# Patient Record
Sex: Male | Born: 1965
Health system: Southern US, Community
[De-identification: ages and names within clinical notes are randomized; demographics above are authoritative.]

## PROBLEM LIST (undated history)

## (undated) DIAGNOSIS — M109 Gout, unspecified: Secondary | ICD-10-CM

## (undated) DIAGNOSIS — E785 Hyperlipidemia, unspecified: Secondary | ICD-10-CM

## (undated) DIAGNOSIS — M1990 Unspecified osteoarthritis, unspecified site: Secondary | ICD-10-CM

## (undated) DIAGNOSIS — H409 Unspecified glaucoma: Secondary | ICD-10-CM

## (undated) DIAGNOSIS — T7840XA Allergy, unspecified, initial encounter: Secondary | ICD-10-CM

## (undated) HISTORY — DX: Unspecified glaucoma: H40.9

## (undated) HISTORY — DX: Unspecified osteoarthritis, unspecified site: M19.90

## (undated) HISTORY — DX: Hyperlipidemia, unspecified: E78.5

## (undated) HISTORY — DX: Allergy, unspecified, initial encounter: T78.40XA

---

## 2012-08-08 ENCOUNTER — Ambulatory Visit (INDEPENDENT_AMBULATORY_CARE_PROVIDER_SITE_OTHER): Payer: BC Managed Care – PPO | Admitting: Physician Assistant

## 2012-08-08 VITALS — BP 117/69 | HR 65 | Temp 98.2°F | Resp 17 | Ht 66.5 in | Wt 158.0 lb

## 2012-08-08 DIAGNOSIS — E785 Hyperlipidemia, unspecified: Secondary | ICD-10-CM

## 2012-08-08 DIAGNOSIS — M2550 Pain in unspecified joint: Secondary | ICD-10-CM

## 2012-08-08 DIAGNOSIS — M62838 Other muscle spasm: Secondary | ICD-10-CM

## 2012-08-08 DIAGNOSIS — M720 Palmar fascial fibromatosis [Dupuytren]: Secondary | ICD-10-CM

## 2012-08-08 LAB — POCT URINALYSIS DIPSTICK
Bilirubin, UA: NEGATIVE
Blood, UA: NEGATIVE
Glucose, UA: NEGATIVE
Ketones, UA: NEGATIVE
Nitrite, UA: NEGATIVE
Spec Grav, UA: 1.02
pH, UA: 7

## 2012-08-08 LAB — POCT UA - MICROSCOPIC ONLY
Bacteria, U Microscopic: NEGATIVE
Casts, Ur, LPF, POC: NEGATIVE
Crystals, Ur, HPF, POC: NEGATIVE
Epithelial cells, urine per micros: NEGATIVE
RBC, urine, microscopic: NEGATIVE

## 2012-08-08 LAB — POCT CBC
HCT, POC: 41.2 % — AB (ref 43.5–53.7)
MCH, POC: 28.5 pg (ref 27–31.2)
MCV: 91 fL (ref 80–97)
MID (cbc): 0.4 (ref 0–0.9)
POC LYMPH PERCENT: 35.5 %L (ref 10–50)
Platelet Count, POC: 226 10*3/uL (ref 142–424)
RBC: 4.53 M/uL — AB (ref 4.69–6.13)
WBC: 5.9 10*3/uL (ref 4.6–10.2)

## 2012-08-08 LAB — LIPID PANEL
Cholesterol: 188 mg/dL (ref 0–200)
HDL: 40 mg/dL (ref >39)
Total CHOL/HDL Ratio: 4.7 Ratio
VLDL: 17 mg/dL (ref 0–40)

## 2012-08-08 MED ORDER — NAPROXEN 500 MG PO TABS: 500.0000 mg | ORAL_TABLET | Freq: Two times a day (BID) | ORAL | Status: DC

## 2012-08-08 MED ORDER — METAXALONE 800 MG PO TABS: 800.0000 mg | ORAL_TABLET | Freq: Three times a day (TID) | ORAL | Status: AC

## 2012-08-08 NOTE — Progress Notes (Signed)
Subjective:    Patient ID: Sean Watkins, male    DOB: Dec 12, 1966, 46 y.o.   MRN: [id number]  HPI 46 yr old hispanic male c/o R pinky pain X several weeks.  Also c/o R upper neck tightness, pain in B medial knees. Ibuprofen only helps a little.  Unsure if he has been bitten by a tick, but he does work on a farm at times.  No known injury. He does do repetitive work with his job, but can't think of any new responsibilities precipitating the pain he is having.  He is R hand dominant and his hand bothers him a lot.  He is unable to straighten his 5th digit out all the way.    No dysuria or penile d/c.  No abdominal s/sx. No f/c.  Review of Systems  All other systems reviewed and are negative.       Objective:   Physical Exam  Nursing note and vitals reviewed. Constitutional: He is oriented to person, place, and time. He appears well-developed and well-nourished.  HENT:  Head: Normocephalic and atraumatic.  Right Ear: External ear normal.  Left Ear: External ear normal.  Nose: Nose normal.  Mouth/Throat: Oropharynx is clear and moist. No oropharyngeal exudate.  Neck: Normal range of motion. Neck supple. No thyromegaly present.  Cardiovascular: Normal rate, regular rhythm and normal heart sounds.   Pulmonary/Chest: Effort normal and breath sounds normal.  Musculoskeletal:       Spasm present throughout trapezius B, R>L.  R hand, 5th digit-unable to fully extend. TTP along flexor tendon all the way into wrist. Normal grip. N-V in tact.  B medial knee-TTP along medial patellar tendon. No visible swelling or erythema.  Joints themselves are stable B.   Lymphadenopathy:    He has no cervical adenopathy.  Neurological: He is oriented to person, place, and time. He has normal reflexes. He displays normal reflexes.       UE DTR=B  Skin: Skin is warm and dry. No rash noted.  Psychiatric: He has a normal mood and affect. His behavior is normal. Thought content normal.     Results for  orders placed in visit on 08/08/12  POCT CBC      Component Value Range   WBC 5.9  4.6 - 10.2 K/uL   Lymph, poc 2.1  0.6 - 3.4   POC LYMPH PERCENT 35.5  10 - 50 %L   MID (cbc) 0.4  0 - 0.9   POC MID % 7.2  0 - 12 %M   POC Granulocyte 3.4  2 - 6.9   Granulocyte percent 57.3  37 - 80 %G   RBC 4.53 (*) 4.69 - 6.13 M/uL   Hemoglobin 12.9 (*) 14.1 - 18.1 g/dL   HCT, POC 65.7 (*) 84.6 - 53.7 %   MCV 91.0  80 - 97 fL   MCH, POC 28.5  27 - 31.2 pg   MCHC 31.3 (*) 31.8 - 35.4 g/dL   RDW, POC 96.2     Platelet Count, POC 226  142 - 424 K/uL   MPV 9.8  0 - 99.8 fL  GLUCOSE, POCT (MANUAL RESULT ENTRY)      Component Value Range   POC Glucose 83  70 - 99 mg/dl  POCT URINALYSIS DIPSTICK      Component Value Range   Color, UA yellow     Clarity, UA clear     Glucose, UA neg     Bilirubin, UA neg  Ketones, UA neg     Spec Grav, UA 1.020     Blood, UA neg     pH, UA 7.0     Protein, UA neg     Urobilinogen, UA 0.2     Nitrite, UA neg     Leukocytes, UA Negative    POCT UA - MICROSCOPIC ONLY      Component Value Range   WBC, Ur, HPF, POC 0-1     RBC, urine, microscopic neg     Bacteria, U Microscopic neg     Mucus, UA trace     Epithelial cells, urine per micros neg     Crystals, Ur, HPF, POC neg     Casts, Ur, LPF, POC neg     Yeast, UA neg          Assessment & Plan:  Musculoskeletal pain-labs pending. Naprosyn and skelaxin for pain R hand-seems like a separate issue all together-likely dupuytren's contracture. Splinted in extension.

## 2012-08-11 ENCOUNTER — Telehealth: Payer: Self-pay

## 2012-08-11 LAB — LYME DISEASE DNA BY PCR(BORRELIA BURG): B burgdorferi DNA: NOT DETECTED

## 2012-08-11 NOTE — Telephone Encounter (Signed)
Pt is calling about labs done on [id number].  Best # Day [phone] Evening#[phone]

## 2012-08-11 NOTE — Telephone Encounter (Signed)
Pt.notified

## 2012-10-27 ENCOUNTER — Ambulatory Visit (INDEPENDENT_AMBULATORY_CARE_PROVIDER_SITE_OTHER): Payer: BC Managed Care – PPO | Admitting: Emergency Medicine

## 2012-10-27 VITALS — BP 126/78 | HR 71 | Temp 98.0°F | Resp 17 | Ht 65.5 in | Wt 158.0 lb

## 2012-10-27 DIAGNOSIS — R197 Diarrhea, unspecified: Secondary | ICD-10-CM

## 2012-10-27 MED ORDER — LOPERAMIDE HCL 2 MG PO TABS: ORAL_TABLET | ORAL | Status: DC

## 2012-10-27 NOTE — Progress Notes (Signed)
  Subjective:    Patient ID: Sean Watkins, male    DOB: 09-Mar-1966, 46 y.o.   MRN: [id number]  HPI Comments: No sick contacts  No improvement with pepto bismol  Abdominal Pain This is a new problem. The current episode started 1 to 4 weeks ago. The onset quality is gradual. The problem occurs 2 to 4 times per day. The problem has been unchanged. The pain is located in the epigastric region. The pain is moderate. The quality of the pain is colicky and cramping. The abdominal pain does not radiate. Associated symptoms include diarrhea. Pertinent negatives include no anorexia, arthralgias, belching, constipation, dysuria, fever, flatus, frequency, headaches, hematochezia, hematuria, melena, myalgias, nausea, vomiting or weight loss. The pain is aggravated by eating. The pain is relieved by nothing.  Diarrhea  Associated symptoms include abdominal pain. Pertinent negatives include no arthralgias, chills, coughing, fever, headaches, increased  flatus, myalgias, vomiting or weight loss.      Review of Systems  Constitutional: Negative for fever, chills, weight loss and diaphoresis.  HENT: Negative for ear pain, congestion, rhinorrhea, neck pain, postnasal drip and ear discharge.   Eyes: Negative for pain, discharge and redness.  Respiratory: Negative for cough, choking, chest tightness, shortness of breath and wheezing.   Cardiovascular: Negative for chest pain, palpitations and leg swelling.  Gastrointestinal: Positive for abdominal pain and diarrhea. Negative for nausea, vomiting, constipation, blood in stool, melena, hematochezia, rectal pain, anorexia and flatus.  Genitourinary: Negative for dysuria, urgency, frequency, hematuria and difficulty urinating.  Musculoskeletal: Negative for myalgias and arthralgias.  Neurological: Negative for headaches.       Objective:   Physical Exam  Constitutional: He is oriented to person, place, and time. He appears well-developed and well-nourished.    HENT:  Head: Normocephalic and atraumatic.  Right Ear: External ear normal.  Left Ear: External ear normal.  Mouth/Throat: No oropharyngeal exudate.  Eyes: Conjunctivae normal are normal. Pupils are equal, round, and reactive to light. No scleral icterus.  Neck: Normal range of motion. Neck supple.  Cardiovascular: Normal rate and regular rhythm.  Exam reveals no friction rub.   No murmur heard. Pulmonary/Chest: Effort normal and breath sounds normal.  Abdominal: Soft. He exhibits no distension and no mass. There is no tenderness. There is no rebound and no guarding.  Musculoskeletal: Normal range of motion.  Neurological: He is alert and oriented to person, place, and time.  Skin: Skin is warm and dry.          Assessment & Plan:  Diarrhea  Imodium   Follow up as needed  Stool studies if not improved

## 2012-10-29 LAB — OVA AND PARASITE SCREEN: OP: NONE SEEN

## 2012-10-29 NOTE — Progress Notes (Signed)
Reviewed and agree.

## 2012-12-30 ENCOUNTER — Ambulatory Visit (INDEPENDENT_AMBULATORY_CARE_PROVIDER_SITE_OTHER): Payer: Self-pay | Admitting: Emergency Medicine

## 2012-12-30 VITALS — BP 136/82 | HR 61 | Temp 98.1°F | Resp 16 | Ht 65.75 in | Wt 163.4 lb

## 2012-12-30 DIAGNOSIS — Z0289 Encounter for other administrative examinations: Secondary | ICD-10-CM

## 2012-12-30 NOTE — Progress Notes (Signed)
  Subjective:    Patient ID: Sean Watkins, male    DOB: 1966/01/14, 47 y.o.   MRN: [id number]  HPI  DOT exam   Review of Systems  As per HPI, otherwise negative.      Objective:   Physical Exam  GEN: WDWN, NAD, Non-toxic, A & O x 3 HEENT: Atraumatic, Normocephalic. Neck supple. No masses, No LAD. Ears and Nose: No external deformity. CV: RRR, No M/G/R. No JVD. No thrill. No extra heart sounds. PULM: CTA B, no wheezes, crackles, rhonchi. No retractions. No resp. distress. No accessory muscle use. ABD: S, NT, ND, +BS. No rebound. No HSM. EXTR: No c/c/e NEURO Normal gait.  PSYCH: Normally interactive. Conversant. Not depressed or anxious appearing.  Calm demeanor.        Assessment & Plan:   DOT exam

## 2013-07-16 ENCOUNTER — Ambulatory Visit: Payer: Self-pay | Admitting: Internal Medicine

## 2013-07-16 VITALS — BP 114/80 | HR 66 | Temp 98.4°F | Resp 16 | Ht 65.5 in | Wt 161.8 lb

## 2013-07-16 DIAGNOSIS — M25572 Pain in left ankle and joints of left foot: Secondary | ICD-10-CM

## 2013-07-16 DIAGNOSIS — M25569 Pain in unspecified knee: Secondary | ICD-10-CM

## 2013-07-16 DIAGNOSIS — M25562 Pain in left knee: Secondary | ICD-10-CM

## 2013-07-16 DIAGNOSIS — M109 Gout, unspecified: Secondary | ICD-10-CM

## 2013-07-16 DIAGNOSIS — Z8739 Personal history of other diseases of the musculoskeletal system and connective tissue: Secondary | ICD-10-CM | POA: Insufficient documentation

## 2013-07-16 DIAGNOSIS — M25579 Pain in unspecified ankle and joints of unspecified foot: Secondary | ICD-10-CM

## 2013-07-16 LAB — POCT CBC
Hemoglobin: 12.9 g/dL — AB (ref 14.1–18.1)
MCH, POC: 29.1 pg (ref 27–31.2)
MCHC: 31.5 g/dL — AB (ref 31.8–35.4)
MID (cbc): 0.5 (ref 0–0.9)
MPV: 10 fL (ref 0–99.8)
POC Granulocyte: 4 (ref 2–6.9)
POC MID %: 7.2 %M (ref 0–12)
Platelet Count, POC: 195 10*3/uL (ref 142–424)
RBC: 4.44 M/uL — AB (ref 4.69–6.13)
WBC: 6.5 10*3/uL (ref 4.6–10.2)

## 2013-07-16 LAB — COMPREHENSIVE METABOLIC PANEL
ALT: 17 U/L (ref 0–53)
AST: 19 U/L (ref 0–37)
CO2: 24 mEq/L (ref 19–32)
Chloride: 105 mEq/L (ref 96–112)
Creat: 0.73 mg/dL (ref 0.50–1.35)
Sodium: 136 mEq/L (ref 135–145)
Total Bilirubin: 0.7 mg/dL (ref 0.3–1.2)
Total Protein: 6.8 g/dL (ref 6.0–8.3)

## 2013-07-16 MED ORDER — INDOMETHACIN 50 MG PO CAPS: 50.0000 mg | ORAL_CAPSULE | Freq: Three times a day (TID) | ORAL | Status: DC

## 2013-07-16 NOTE — Progress Notes (Signed)
  Subjective:    Patient ID: Sean Watkins, male    DOB: 02-Apr-1966, 47 y.o.   MRN: [id number]  HPI Paper chart and epic reviewed in detail. Has hx of sudden onset joint pain, usually left 1st mtpj with swelling. Knee has ached some recently also. Presumptive gout dxed in past. Uric acid level not found in record. No fever, body aches.   Review of Systems See charts    Objective:   Physical Exam  Vitals reviewed. Constitutional: He is oriented to person, place, and time. He appears well-developed and well-nourished. No distress.  Eyes: EOM are normal.  Cardiovascular: Normal rate, regular rhythm and normal heart sounds.   Pulmonary/Chest: Effort normal and breath sounds normal.  Musculoskeletal: He exhibits edema and tenderness.       Left foot: He exhibits decreased range of motion, tenderness, bony tenderness and swelling.       Feet:  Neurological: He is alert and oriented to person, place, and time. No cranial nerve deficit. He exhibits normal muscle tone. Coordination normal.  Skin: Skin is warm. There is erythema.  Psychiatric: He has a normal mood and affect.    labs      Assessment & Plan:  Gout likely Indocin 50mg  tid 30

## 2013-07-16 NOTE — Patient Instructions (Signed)
Gout  Gout is an inflammatory condition (arthritis) caused by a buildup of uric acid crystals in the joints. Uric acid is a chemical that is normally present in the blood. Under some circumstances, uric acid can form into crystals in your joints. This causes joint redness, soreness, and swelling (inflammation). Repeat attacks are common. Over time, uric acid crystals can form into masses (tophi) near a joint, causing disfigurement. Gout is treatable and often preventable.  CAUSES   The disease begins with elevated levels of uric acid in the blood. Uric acid is produced by your body when it breaks down a naturally found substance called purines. This also happens when you eat certain foods such as meats and fish. Causes of an elevated uric acid level include:   Being passed down from parent to child (heredity).   Diseases that cause increased uric acid production (obesity, psoriasis, some cancers).   Excessive alcohol use.   Diet, especially diets rich in meat and seafood.   Medicines, including certain cancer-fighting drugs (chemotherapy), diuretics, and aspirin.   Chronic kidney disease. The kidneys are no longer able to remove uric acid well.   Problems with metabolism.  Conditions strongly associated with gout include:   Obesity.   High blood pressure.   High cholesterol.   Diabetes.  Not everyone with elevated uric acid levels gets gout. It is not understood why some people get gout and others do not. Surgery, joint injury, and eating too much of certain foods are some of the factors that can lead to gout.  SYMPTOMS    An attack of gout comes on quickly. It causes intense pain with redness, swelling, and warmth in a joint.   Fever can occur.   Often, only one joint is involved. Certain joints are more commonly involved:   Base of the big toe.   Knee.   Ankle.   Wrist.   Finger.  Without treatment, an attack usually goes away in a few days to weeks. Between attacks, you usually will not have  symptoms, which is different from many other forms of arthritis.  DIAGNOSIS   Your caregiver will suspect gout based on your symptoms and exam. Removal of fluid from the joint (arthrocentesis) is done to check for uric acid crystals. Your caregiver will give you a medicine that numbs the area (local anesthetic) and use a needle to remove joint fluid for exam. Gout is confirmed when uric acid crystals are seen in joint fluid, using a special microscope. Sometimes, blood, urine, and X-ray tests are also used.  TREATMENT   There are 2 phases to gout treatment: treating the sudden onset (acute) attack and preventing attacks (prophylaxis).  Treatment of an Acute Attack   Medicines are used. These include anti-inflammatory medicines or steroid medicines.   An injection of steroid medicine into the affected joint is sometimes necessary.   The painful joint is rested. Movement can worsen the arthritis.   You may use warm or cold treatments on painful joints, depending which works best for you.   Discuss the use of coffee, vitamin C, or cherries with your caregiver. These may be helpful treatment options.  Treatment to Prevent Attacks  After the acute attack subsides, your caregiver may advise prophylactic medicine. These medicines either help your kidneys eliminate uric acid from your body or decrease your uric acid production. You may need to stay on these medicines for a very long time.  The early phase of treatment with prophylactic medicine can be associated   with an increase in acute gout attacks. For this reason, during the first few months of treatment, your caregiver may also advise you to take medicines usually used for acute gout treatment. Be sure you understand your caregiver's directions.  You should also discuss dietary treatment with your caregiver. Certain foods such as meats and fish can increase uric acid levels. Other foods such as dairy can decrease levels. Your caregiver can give you a list of foods  to avoid.  HOME CARE INSTRUCTIONS    Do not take aspirin to relieve pain. This raises uric acid levels.   Only take over-the-counter or prescription medicines for pain, discomfort, or fever as directed by your caregiver.   Rest the joint as much as possible. When in bed, keep sheets and blankets off painful areas.   Keep the affected joint raised (elevated).   Use crutches if the painful joint is in your leg.   Drink enough water and fluids to keep your urine clear or pale yellow. This helps your body get rid of uric acid. Do not drink alcoholic beverages. They slow the passage of uric acid.   Follow your caregiver's dietary instructions. Pay careful attention to the amount of protein you eat. Your daily diet should emphasize fruits, vegetables, whole grains, and fat-free or low-fat milk products.   Maintain a healthy body weight.  SEEK MEDICAL CARE IF:    You have an oral temperature above 102 F (38.9 C).   You develop diarrhea, vomiting, or any side effects from medicines.   You do not feel better in 24 hours, or you are getting worse.  SEEK IMMEDIATE MEDICAL CARE IF:    Your joint becomes suddenly more tender and you have:   Chills.   An oral temperature above 102 F (38.9 C), not controlled by medicine.  MAKE SURE YOU:    Understand these instructions.   Will watch your condition.   Will get help right away if you are not doing well or get worse.  Document Released: 12/06/2000 Document Revised: 03/02/2012 Document Reviewed: 03/19/2010  Exit[employer] Patient Information 2014 [employer].

## 2013-07-20 ENCOUNTER — Telehealth: Payer: Self-pay

## 2013-07-20 MED ORDER — MELOXICAM 15 MG PO TABS: 15.0000 mg | ORAL_TABLET | Freq: Every day | ORAL | Status: DC

## 2013-07-20 NOTE — Telephone Encounter (Signed)
Assessment & Plan:   Gout likely  Indocin 50mg  tid 30  Uric acid normal, please advise

## 2013-07-20 NOTE — Telephone Encounter (Signed)
Patient was seen for foot pain last week.  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   The pain medication gave his a rash, so he quit taking it.   He is asking for something different.   [facility]  [address]  Cell # [phone]  Wife #  [phone]

## 2013-07-20 NOTE — Telephone Encounter (Signed)
Stop the indomethacin.  Try Meloxicam.  Meds ordered this encounter  Medications  . meloxicam (MOBIC) 15 MG tablet    Sig: Take 1 tablet (15 mg total) by mouth daily.    Dispense:  30 tablet    Refill:  1    Order Specific Question:  Supervising Provider    Answer:  DOOLITTLE, ROBERT P [[id number]]

## 2013-07-20 NOTE — Telephone Encounter (Signed)
Pt.notified

## 2013-07-22 ENCOUNTER — Encounter: Payer: Self-pay | Admitting: Family Medicine

## 2013-07-24 ENCOUNTER — Ambulatory Visit: Payer: Self-pay | Admitting: Physician Assistant

## 2013-07-24 VITALS — BP 110/80 | HR 54 | Temp 98.0°F | Resp 16 | Ht 66.5 in | Wt 162.0 lb

## 2013-07-24 DIAGNOSIS — M25572 Pain in left ankle and joints of left foot: Secondary | ICD-10-CM

## 2013-07-24 DIAGNOSIS — L270 Generalized skin eruption due to drugs and medicaments taken internally: Secondary | ICD-10-CM

## 2013-07-24 DIAGNOSIS — M25579 Pain in unspecified ankle and joints of unspecified foot: Secondary | ICD-10-CM

## 2013-07-24 DIAGNOSIS — L299 Pruritus, unspecified: Secondary | ICD-10-CM

## 2013-07-24 MED ORDER — CLOBETASOL PROPIONATE 0.05 % EX CREA: TOPICAL_CREAM | Freq: Two times a day (BID) | CUTANEOUS | Status: DC

## 2013-07-24 NOTE — Progress Notes (Signed)
  Subjective:    Patient ID: Sean Watkins, male    DOB: 02/13/1966, 47 y.o.   MRN: [id number]  HPI  This 47 y.o. male presents for evaluation of RIGHT foot pain.  He was seen last week and diagnosed with gout.  Prescribed indocin.  Unfortunately, he developed an itchy rash on the legs, trunk, groin and arm, so D/C'd it.  He contacted [place] and we changed him to meloxicam, and he took the first dose last night.  So far, he's tolerating it well and his foot pain has decreased.  No fever, chills.  No new rash.  The resolving lesions are still itchy and he asks for a cream.  CBC and uric acid were normal.  He notes that his father had gout. As a truck driver, he doesn't typically drink many fluids.   Review of Systems As above.    Objective:   Physical Exam Blood pressure 110/80, pulse 54, temperature 98 F (36.7 C), temperature source Oral, resp. rate 16, height 5' 6.5" (1.689 m), weight 162 lb (73.483 kg), SpO2 100.00%. Body mass index is 25.76 kg/(m^2). Well-developed, well nourished Hispanic male who is awake, alert and oriented, in NAD. HEENT: [place]/AT, sclera and conjunctiva are clear.   Neck: supple, non-tender, no lymphadenopathy, thyromegaly. Heart: Regular rate Lungs: normal effort Extremities: no cyanosis, clubbing. No erythema. No ecchymosis. No lesions. Mild edema of the forefoot.  Mildly tender.  No increased warmth. Skin: warm and dry without rash. Psychologic: good mood and appropriate affect, normal speech and behavior.        Assessment & Plan:  Pain in joint, ankle and foot, left - continue meloxicam.  Elevate. Rest. Hydrate. Anticipatory guidance. OOW note x 2 days, RTW Monday 07/26/2013  Dermatitis due to drug reaction/Itching - Plan: clobetasol cream (TEMOVATE) 0.05 %    Fernande Bras, PA-C Physician Assistant-Certified [facility]

## 2013-07-24 NOTE — Patient Instructions (Signed)
COntinue the MELOXICAM one time each day for the foot pain. Use the cream for the itching, you only need a thin layer. Drink plenty of fluids, and elevate the foot.

## 2014-11-21 ENCOUNTER — Ambulatory Visit (INDEPENDENT_AMBULATORY_CARE_PROVIDER_SITE_OTHER): Payer: Self-pay | Admitting: Emergency Medicine

## 2014-11-21 VITALS — BP 110/70 | HR 73 | Temp 98.7°F | Resp 16 | Ht 67.0 in | Wt 170.0 lb

## 2014-11-21 DIAGNOSIS — K644 Residual hemorrhoidal skin tags: Secondary | ICD-10-CM

## 2014-11-21 DIAGNOSIS — B356 Tinea cruris: Secondary | ICD-10-CM

## 2014-11-21 DIAGNOSIS — K648 Other hemorrhoids: Secondary | ICD-10-CM

## 2014-11-21 MED ORDER — TERBINAFINE HCL 250 MG PO TABS: 250.0000 mg | ORAL_TABLET | Freq: Every day | ORAL | Status: DC

## 2014-11-21 MED ORDER — DOCUSATE SODIUM 100 MG PO CAPS: 100.0000 mg | ORAL_CAPSULE | Freq: Two times a day (BID) | ORAL | Status: DC

## 2014-11-21 MED ORDER — TOLNAFTATE 1 % EX POWD: 1.0000 "application " | Freq: Two times a day (BID) | CUTANEOUS | Status: DC

## 2014-11-21 NOTE — Patient Instructions (Signed)
Gota  °(Gout) ° La gota es una artritis inflamatoria originada por la acumulación de cristales de ácido úrico en las articulaciones. El ácido úrico es una sustancia química que normalmente se encuentra en la sangre. Cuando los niveles de ácido úrico en la sangre son muy elevados, pueden formarse cristales que se depositan en las articulaciones y los tejidos. Esto causa irritación, dolor e hinchazón (inflamación). La repetición de los ataques es frecuente. Con el tiempo, los cristales de ácido úrico pueden formar masas (tofos) cerca de una articulación, destruyen el hueso y provocan una deformidad La gota es una enfermedad que puede tratarse y con frecuencia puede prevenirse. °CAUSAS  °La enfermedad comienza con niveles altos de ácido úrico en la sangre. El organismo produce ácido úrico cuando metaboliza una sustancia que se encuentra en estado natural, denominada purina. Ciertos alimentos, como carnes y pescado, contienen grandes cantidades de purinas. Las causas de ácido úrico elevado son:  °· Ser transmitida de padres a hijos (hereditaria). °· Enfermedades que causan un aumento de la producción de ácido úrico (como la obesidad, la psoriasis y ciertos tipos de cáncer). °· Abuso en el consumo de bebidas alcohólicas. °· La dieta, especialmente las dietas en las que se consume mucha carne y frutos de mar. °· Ciertos medicamentos, como los que combaten el cáncer (quimioterapia), los diuréticos y la aspirina. °· Enfermedades renales crónicas. Los riñones no pueden eliminar bien el ácido úrico. °· Problemas con el metabolismo. °Las enfermedades fuertemente asociadas a la gota son:  °· Obesidad. °· Hipertensión arterial. °· Colesterol elevado. °· Diabetes. °No todas las personas con niveles elevados de ácido úrico sufren gota. No se comprende aún por qué algunas personas padecen gota y otras no. Las cirugías, lesiones en una articulación y el consumo excesivo de ciertos alimentos son algunos de los factores que pueden  provocar ataques de gota.  °SÍNTOMAS  °· Un ataque de gota puede comenzar rápidamente. Causa un dolor intenso, con irritación, hinchazón y calor en una articulación. °· Puede haber fiebre. °· Generalmente sólo una articulación es afectada. Ciertas articulaciones se ven implicadas con más frecuencia. °¨ La base del dedo gordo del pie. °¨ La rodilla. °¨ El tobillo. °¨ La muñeca. °¨ Un dedo. °Sin tratamiento, un ataque por lo general desaparece en unos pocos días o semanas. Entre un ataque y otro, por lo general no hay síntomas, lo cual es diferente de muchas otras formas de artritis.  °DIAGNÓSTICO  °El profesional reunirá la información basándose en los síntomas y el examen físico. En algunos casos indicará estudios. Estos estudios pueden ser:  °· Análisis de sangre. °· Análisis de orina. °· Radiografías. °· Análisis de los fluidos de la articulación. En este estudio se necesita una aguja para extraer líquido de la articulación (artrocentesis). Con el uso del microscopio, se confirma la gota cuando se observan los cristales de ácido úrico en el líquido de la articulación. °TRATAMIENTO  °Hay dos fases en el tratamiento de la gota: el tratamiento del ataque de aparición repentina (agudo) y la prevención de los ataques (profilaxis).  °· Tratamiento de un ataque agudo. °¨ Se utilizan medicamentos. Se indican antiinflamatorios o corticoides. °¨ En algunos casos es necesario inyectar un corticoide en la articulación afectada. °¨ La articulación dolorosa se pone en reposo. El movimiento puede empeorar la artritis. °¨ Puede utilizar tanto tratamientos con calor o con frío para aliviar el dolor en las articulaciones, según lo que le haga mejor. °· Tratamiento para prevenir ataques. °¨ Si sufre de ataques de gota frecuentes,   el médico puede recomendarle medicamentos preventivos. Estos medicamentos se administran después de que el ataque agudo mejora. Estos medicamentos ayudan a los riñones a eliminar el ácido úrico del  organismo o a disminuir la producción de ácido úrico. Es posible que deba utilizar estos medicamentos durante un largo tiempo. °¨ La primera fase del tratamiento preventiva puede asociarse con un aumento de los ataques agudos de gota. Por esta razón, durante los primeros meses de tratamiento, su médico puede también aconsejarle que tome medicamentos que habitualmente se utilizan para el tratamiento de la gota aguda. Asegúrese de comprender todas las indicaciones del médico. El médico podrá hacer varios ajustes a la dosis del medicamento antes de que comiencen a ser efectivos. °¨ Comente el tratamiento dietético con su médico o nutricionista. El alcohol y las bebidas que contienen gran cantidad de azúcar y fructosa y los alimentos como la carne, el pollo y los frutos de mar pueden aumentar los niveles de ácido úrico. El médico o el nutricionista podrá aconsejarlo sobre las bebidas y los alimentos que debe limitar. °INSTRUCCIONES PARA EL CUIDADO EN EL HOGAR  °· No tome aspirina para el dolor. Esto eleva los niveles de ácido úrico. °· Sólo tome medicamentos de venta libre o prescriptos para calmar el dolor, las molestias o bajar la fiebre según las indicaciones de su médico. °· Haga reposo todo el tiempo que pueda. Cuando se encuentre en la cama, mantenga las sábanas y mantas alejadas de las articulaciones doloridas. °· Mantenga la articulación afectada levantada (elevada). °· Aplique compresas frías o calientes sobre las articulaciones doloridas. el uso de compresas calientes o frías depende de lo que mejor le resulte a usted. °· Utilice muletas si la articulación que le duele es de la pierna. °· Debe ingerir gran cantidad de líquido para mantener la orina de tono claro o color amarillo pálido. Esto ayudará a que el organismo elimine el ácido úrico. Limite el consumo de alcohol, bebidas azucaradas y que contengan fructosa. °· Siga las indicaciones con respecto a la dieta. Preste especial atención a la cantidad de  proteínas que consume. En la dieta diaria debe enfatizar el consumo de frutas, vegetales, granos enteros y productos lácteos descremados. Comente con su médico o nutricionista acerca del consumo de café, vitamina C o cerezas. Estos pueden ayudar a disminuir los niveles de ácido úrico. °· Mantenga un peso corporal adecuado. °SOLICITE ATENCIÓN MÉDICA SI:  °· Tiene diarrea, vómitos o algún efecto secundario provocado por los medicamentos. °· No se siente mejor en 24 horas, o empeora. °SOLICITE ATENCIÓN MÉDICA DE INMEDIATO SI:  °· Las articulaciones le duelen más de manera repentina, tiene escalofríos o fiebre. °ASEGÚRESE DE QUE:  °· Comprende estas instrucciones. °· Controlará su enfermedad. °· Solicitará ayuda de inmediato si no mejora o si empeora. °Document Released: 09/18/2005 Document Revised: 04/05/2013 °ExitCare® Patient Information ©2015 [employer]. This information is not intended to replace advice given to you by your health care provider. Make sure you discuss any questions you have with your health care provider. ° °

## 2014-11-21 NOTE — Progress Notes (Signed)
[facility] [address], [place] [address] [phone]  Date:  11/21/2014   Name:  Sean MoodyJuan Watkins   DOB:  November 21, 1966   MRN:  [id number]  PCP:  Tally DueGUEST, CHRIS WARREN, MD    Chief Complaint: Rash   History of Present Illness:  Sean Watkins is a 48 y.o. very pleasant male patient who presents with the following:  Tinea cruris and pain in rectum. Previously treated with temovate No blood in stool or painful stools. Denies other complaint or health concern today.   Patient Active Problem List   Diagnosis Date Noted  . Gout attack 07/16/2013    Past Medical History  Diagnosis Date  . Allergy   . Asthma   . Arthritis     History reviewed. No pertinent past surgical history.  History  Substance Use Topics  . Smoking status: Never Smoker   . Smokeless tobacco: Not on file  . Alcohol Use: No    Family History  Problem Relation Age of Onset  . Heart disease Mother   . Stroke Father     Allergies  Allergen Reactions  . Indocin [Indomethacin] Rash    POSSIBLY due to indocin    Medication list has been reviewed and updated.  No current outpatient prescriptions on file prior to visit.   No current facility-administered medications on file prior to visit.    Review of Systems:  As per HPI, otherwise negative.    Physical Examination: Filed Vitals:   11/21/14 1629  BP: 110/70  Pulse: 73  Temp: 98.7 F (37.1 C)  Resp: 16   Filed Vitals:   11/21/14 1629  Height: 5\' 7"  (1.702 m)  Weight: 170 lb (77.111 kg)   Body mass index is 26.62 kg/(m^2). Ideal Body Weight: Weight in (lb) to have BMI = 25: 159.3   GEN: WDWN, NAD, Non-toxic, Alert & Oriented x 3 HEENT: Atraumatic, Normocephalic.  Ears and Nose: No external deformity. EXTR: No clubbing/cyanosis/edema NEURO: Normal gait.  PSYCH: Normally interactive. Conversant. Not depressed or anxious appearing.  Calm demeanor.  Tinea cruris. Small external hemorrhoid no  clot  Assessment and Plan: External hemorrhoid Tinea cruris lamisil powder Colace lamisil tab  Signed,  Phillips OdorJeffery Makesha Belitz, MD

## 2014-12-26 ENCOUNTER — Ambulatory Visit (INDEPENDENT_AMBULATORY_CARE_PROVIDER_SITE_OTHER): Payer: Self-pay | Admitting: Emergency Medicine

## 2014-12-26 DIAGNOSIS — Z021 Encounter for pre-employment examination: Secondary | ICD-10-CM

## 2014-12-26 MED ORDER — FLUCONAZOLE 100 MG PO TABS: ORAL_TABLET | ORAL | Status: DC

## 2014-12-26 NOTE — Progress Notes (Signed)
[facility] [address] [place] [address] [phone]  Date:  12/26/2014   Name:  Sean Watkins   DOB:  August 14, 1966   MRN:  [id number]  PCP:  Tally Due, MD    Chief Complaint: No chief complaint on file.   History of Present Illness:  Sean Watkins is a 49 y.o. very pleasant male patient who presents with the following:  DOT   Patient Active Problem List   Diagnosis Date Noted  . Gout attack 07/16/2013    Past Medical History  Diagnosis Date  . Allergy   . Asthma   . Arthritis     No past surgical history on file.  History  Substance Use Topics  . Smoking status: Never Smoker   . Smokeless tobacco: Not on file  . Alcohol Use: No    Family History  Problem Relation Age of Onset  . Heart disease Mother   . Stroke Father     Allergies  Allergen Reactions  . Indocin [Indomethacin] Rash    POSSIBLY due to indocin    Medication list has been reviewed and updated.  Current Outpatient Prescriptions on File Prior to Visit  Medication Sig Dispense Refill  . docusate sodium (COLACE) 100 MG capsule Take 1 capsule (100 mg total) by mouth 2 (two) times daily. 30 capsule 0  . terbinafine (LAMISIL) 250 MG tablet Take 1 tablet (250 mg total) by mouth daily. 14 tablet 0  . tolnaftate (TINACTIN) 1 % powder Apply 1 application topically 2 (two) times daily. 45 g 0   No current facility-administered medications on file prior to visit.    Review of Systems:  As per HPI, otherwise negative.    Physical Examination: There were no vitals filed for this visit. There were no vitals filed for this visit. There is no weight on file to calculate BMI. Ideal Body Weight:    GEN: WDWN, NAD, Non-toxic, A & O x 3 HEENT: Atraumatic, Normocephalic. Neck supple. No masses, No LAD. Ears and Nose: No external deformity. CV: RRR, No M/G/R. No JVD. No thrill. No extra heart sounds. PULM: CTA B, no wheezes, crackles, rhonchi. No retractions. No  resp. distress. No accessory muscle use. ABD: S, NT, ND, +BS. No rebound. No HSM. EXTR: No c/c/e NEURO Normal gait.  PSYCH: Normally interactive. Conversant. Not depressed or anxious appearing.  Calm demeanor.    Assessment and Plan: DOT  Signed,  Phillips Odor, MD

## 2015-05-13 ENCOUNTER — Ambulatory Visit (INDEPENDENT_AMBULATORY_CARE_PROVIDER_SITE_OTHER): Payer: No Typology Code available for payment source

## 2015-05-13 ENCOUNTER — Ambulatory Visit (INDEPENDENT_AMBULATORY_CARE_PROVIDER_SITE_OTHER): Payer: No Typology Code available for payment source | Admitting: Internal Medicine

## 2015-05-13 VITALS — BP 102/72 | HR 54 | Temp 98.4°F | Resp 16 | Ht 66.5 in | Wt 169.0 lb

## 2015-05-13 DIAGNOSIS — Z8639 Personal history of other endocrine, nutritional and metabolic disease: Secondary | ICD-10-CM

## 2015-05-13 DIAGNOSIS — M79671 Pain in right foot: Secondary | ICD-10-CM | POA: Diagnosis not present

## 2015-05-13 DIAGNOSIS — Z8739 Personal history of other diseases of the musculoskeletal system and connective tissue: Secondary | ICD-10-CM

## 2015-05-13 LAB — COMPLETE METABOLIC PANEL WITH GFR
ALT: 22 U/L (ref 0–53)
AST: 19 U/L (ref 0–37)
Albumin: 4.2 g/dL (ref 3.5–5.2)
Alkaline Phosphatase: 57 U/L (ref 39–117)
BUN: 14 mg/dL (ref 6–23)
CO2: 23 mEq/L (ref 19–32)
CREATININE: 0.7 mg/dL (ref 0.50–1.35)
Calcium: 9.3 mg/dL (ref 8.4–10.5)
Chloride: 106 mEq/L (ref 96–112)
Glucose, Bld: 91 mg/dL (ref 70–99)
Potassium: 4.4 mEq/L (ref 3.5–5.3)
Sodium: 139 mEq/L (ref 135–145)
TOTAL PROTEIN: 6.9 g/dL (ref 6.0–8.3)
Total Bilirubin: 0.4 mg/dL (ref 0.2–1.2)

## 2015-05-13 LAB — URIC ACID: URIC ACID, SERUM: 7 mg/dL (ref 4.0–7.8)

## 2015-05-13 MED ORDER — MELOXICAM 15 MG PO TABS: 15.0000 mg | ORAL_TABLET | Freq: Every day | ORAL | Status: DC

## 2015-05-13 NOTE — Patient Instructions (Addendum)
Hydrate with water, 64oz of water, which is about 4 regular sized water bottles.  Gout Gout is an inflammatory arthritis caused by a buildup of uric acid crystals in the joints. Uric acid is a chemical that is normally present in the blood. When the level of uric acid in the blood is too high it can form crystals that deposit in your joints and tissues. This causes joint redness, soreness, and swelling (inflammation). Repeat attacks are common. Over time, uric acid crystals can form into masses (tophi) near a joint, destroying bone and causing disfigurement. Gout is treatable and often preventable. CAUSES  The disease begins with elevated levels of uric acid in the blood. Uric acid is produced by your body when it breaks down a naturally found substance called purines. Certain foods you eat, such as meats and fish, contain high amounts of purines. Causes of an elevated uric acid level include:  Being passed down from parent to child (heredity).  Diseases that cause increased uric acid production (such as obesity, psoriasis, and certain cancers).  Excessive alcohol use.  Diet, especially diets rich in meat and seafood.  Medicines, including certain cancer-fighting medicines (chemotherapy), water pills (diuretics), and aspirin.  Chronic kidney disease. The kidneys are no longer able to remove uric acid well.  Problems with metabolism. Conditions strongly associated with gout include:  Obesity.  High blood pressure.  High cholesterol.  Diabetes. Not everyone with elevated uric acid levels gets gout. It is not understood why some people get gout and others do not. Surgery, joint injury, and eating too much of certain foods are some of the factors that can lead to gout attacks. SYMPTOMS   An attack of gout comes on quickly. It causes intense pain with redness, swelling, and warmth in a joint.  Fever can occur.  Often, only one joint is involved. Certain joints are more commonly  involved:  Base of the big toe.  Knee.  Ankle.  Wrist.  Finger. Without treatment, an attack usually goes away in a few days to weeks. Between attacks, you usually will not have symptoms, which is different from many other forms of arthritis. DIAGNOSIS  Your caregiver will suspect gout based on your symptoms and exam. In some cases, tests may be recommended. The tests may include:  Blood tests.  Urine tests.  X-rays.  Joint fluid exam. This exam requires a needle to remove fluid from the joint (arthrocentesis). Using a microscope, gout is confirmed when uric acid crystals are seen in the joint fluid. TREATMENT  There are two phases to gout treatment: treating the sudden onset (acute) attack and preventing attacks (prophylaxis).  Treatment of an Acute Attack.  Medicines are used. These include anti-inflammatory medicines or steroid medicines.  An injection of steroid medicine into the affected joint is sometimes necessary.  The painful joint is rested. Movement can worsen the arthritis.  You may use warm or cold treatments on painful joints, depending which works best for you.  Treatment to Prevent Attacks.  If you suffer from frequent gout attacks, your caregiver may advise preventive medicine. These medicines are started after the acute attack subsides. These medicines either help your kidneys eliminate uric acid from your body or decrease your uric acid production. You may need to stay on these medicines for a very long time.  The early phase of treatment with preventive medicine can be associated with an increase in acute gout attacks. For this reason, during the first few months of treatment, your caregiver may  also advise you to take medicines usually used for acute gout treatment. Be sure you understand your caregiver's directions. Your caregiver may make several adjustments to your medicine dose before these medicines are effective.  Discuss dietary treatment with your  caregiver or dietitian. Alcohol and drinks high in sugar and fructose and foods such as meat, poultry, and seafood can increase uric acid levels. Your caregiver or dietitian can advise you on drinks and foods that should be limited. HOME CARE INSTRUCTIONS   Do not take aspirin to relieve pain. This raises uric acid levels.  Only take over-the-counter or prescription medicines for pain, discomfort, or fever as directed by your caregiver.  Rest the joint as much as possible. When in bed, keep sheets and blankets off painful areas.  Keep the affected joint raised (elevated).  Apply warm or cold treatments to painful joints. Use of warm or cold treatments depends on which works best for you.  Use crutches if the painful joint is in your leg.  Drink enough fluids to keep your urine clear or pale yellow. This helps your body get rid of uric acid. Limit alcohol, sugary drinks, and fructose drinks.  Follow your dietary instructions. Pay careful attention to the amount of protein you eat. Your daily diet should emphasize fruits, vegetables, whole grains, and fat-free or low-fat milk products. Discuss the use of coffee, vitamin C, and cherries with your caregiver or dietitian. These may be helpful in lowering uric acid levels.  Maintain a healthy body weight. SEEK MEDICAL CARE IF:   You develop diarrhea, vomiting, or any side effects from medicines.  You do not feel better in 24 hours, or you are getting worse. SEEK IMMEDIATE MEDICAL CARE IF:   Your joint becomes suddenly more tender, and you have chills or a fever. MAKE SURE YOU:   Understand these instructions.  Will watch your condition.  Will get help right away if you are not doing well or get worse. Document Released: 12/06/2000 Document Revised: 04/25/2014 Document Reviewed: 07/22/2012 [facility] Patient Information 2015 [place], [place]. This information is not intended to replace advice given to you by your health care provider. Make  sure you discuss any questions you have with your health care provider.

## 2015-05-13 NOTE — Progress Notes (Signed)
[facility] [address], [place] [address] [phone]  Date:  05/13/2015   Name:  Sean Watkins   DOB:  12/27/1965   MRN:  [id number]  PCP:  Tally Due, MD    Chief Complaint: Foot Pain   History of Present Illness:  Sean Watkins is a 49 y.o. very pleasant male with PMH of gout who presents with the following:  Patient reports one week of foot pain between 2nd and 3rd toe pain with some swelling.  He states that it is a aching pain that is exacerbated with weight on the foot.  He has denies any trauma to the foot.  He is a [employer].  He states he has indulged in more red meat.  Alcohol intake is about 4-5 drinks on the weekend without 3 drinks max per sitting.  He has had no new medications.  Father has a hx of gout.  1st and last gout flare was about 2 years ago, it resolved with NSAID use, but he had no report of elevated uric acid, and normal CMP.  Patient Active Problem List   Diagnosis Date Noted  . Gout attack 07/16/2013    Past Medical History  Diagnosis Date  . Allergy   . Asthma   . Arthritis     History reviewed. No pertinent past surgical history.  History  Substance Use Topics  . Smoking status: Never Smoker   . Smokeless tobacco: Never Used  . Alcohol Use: No    Family History  Problem Relation Age of Onset  . Heart disease Mother   . Stroke Father     Allergies  Allergen Reactions  . Indocin [Indomethacin] Rash    POSSIBLY due to indocin    Medication list has been reviewed and updated.  No current outpatient prescriptions on file prior to visit.   No current facility-administered medications on file prior to visit.    Review of Systems: ROS otherwise unremarkable unless listed above.  Physical Examination: Filed Vitals:   05/13/15 1049  BP: 102/72  Pulse: 54  Temp: 98.4 F (36.9 C)  Resp: 16   Filed Vitals:   05/13/15 1049  Height: 5' 6.5" (1.689 m)  Weight: 169 lb (76.658 kg)   Body  mass index is 26.87 kg/(m^2). Ideal Body Weight: Weight in (lb) to have BMI = 25: 156.9 Physical Exam  Constitutional: He is oriented to person, place, and time. He appears well-developed and well-nourished. No distress.  Eyes: Pupils are equal, round, and reactive to light.  Cardiovascular: Normal rate, regular rhythm and intact distal pulses.  Exam reveals no friction rub.   No murmur heard. Pulmonary/Chest: Effort normal and breath sounds normal. No respiratory distress. He has no wheezes.  Musculoskeletal:       Right foot: There is normal range of motion.  Right foot: Increased swelling extending from the MTP joint extending proximally along the foot.  Pain with passive movement of 1st-3rd toes, however normal ROM.  +Squeeze test.  Normal DP pulse and capillary refill.   Feet:  Right Foot:  Skin Integrity: Negative for skin breakdown, erythema, warmth or callus.  Neurological: He is alert and oriented to person, place, and time.  Skin: Skin is warm and dry.  Psychiatric: He has a normal mood and affect. His behavior is normal.    [facility] reading (PRIMARY) by  Dr. Perrin Maltese: No acute fractures.  Please comment on sesamoid bone.  Assessment and Plan: 49 year old  male is here today for chief complaint of foot pain for 1 week. -Given meloxicam, as this provided relief with prior use without the allergic reaction of the indocin.  Foot pain, right - Plan: DG Foot Complete Right, COMPLETE METABOLIC PANEL WITH GFR, Uric Acid, meloxicam (MOBIC) 15 MG tablet  Hx of gout - Plan: COMPLETE METABOLIC PANEL WITH GFR, Uric Acid, meloxicam (MOBIC) 15 MG tablet  Trena PlattStephanie Sara Keys, PA-C [facility] 5/24/20169:34 AM

## 2016-02-24 ENCOUNTER — Ambulatory Visit (INDEPENDENT_AMBULATORY_CARE_PROVIDER_SITE_OTHER): Payer: BLUE CROSS/BLUE SHIELD | Admitting: Family Medicine

## 2016-02-24 VITALS — BP 122/70 | HR 73 | Temp 98.2°F | Resp 12 | Ht 66.0 in | Wt 173.0 lb

## 2016-02-24 DIAGNOSIS — J301 Allergic rhinitis due to pollen: Secondary | ICD-10-CM | POA: Diagnosis not present

## 2016-02-24 MED ORDER — DESLORATADINE-PSEUDOEPHED ER 5-240 MG PO TB24: 1.0000 | ORAL_TABLET | Freq: Every day | ORAL | Status: DC

## 2016-02-24 MED ORDER — AYR SALINE NASAL NA GEL: 1.0000 "application " | NASAL | Status: DC

## 2016-02-24 NOTE — Patient Instructions (Signed)
Hay Fever Hay fever is an allergic reaction to particles in the air. It cannot be passed from person to person. It cannot be cured, but it can be controlled. CAUSES  Hay fever is caused by something that triggers an allergic reaction (allergens). The following are examples of allergens:  Ragweed.  Feathers.  Animal dander.  Grass and tree pollens.  Cigarette smoke.  House dust.  Pollution. SYMPTOMS   Sneezing.  Runny or stuffy nose.  Tearing eyes.  Itchy eyes, nose, mouth, throat, skin, or other area.  Sore throat.  Headache.  Decreased sense of smell or taste. DIAGNOSIS Your caregiver will perform a physical exam and ask questions about the symptoms you are having.Allergy testing may be done to determine exactly what triggers your hay fever.  TREATMENT   Over-the-counter medicines may help symptoms. These include:  Antihistamines.  Decongestants. These may help with nasal congestion.  Your caregiver may prescribe medicines if over-the-counter medicines do not work.  Some people benefit from allergy shots when other medicines are not helpful. HOME CARE INSTRUCTIONS   Avoid the allergen that is causing your symptoms, if possible.  Take all medicine as told by your caregiver. SEEK MEDICAL CARE IF:   You have severe allergy symptoms and your current medicines are not helping.  Your treatment was working at one time, but you are now experiencing symptoms.  You have sinus congestion and pressure.  You develop a fever or headache.  You have thick nasal discharge.  You have asthma and have a worsening cough and wheezing. SEEK IMMEDIATE MEDICAL CARE IF:   You have swelling of your tongue or lips.  You have trouble breathing.  You feel lightheaded or like you are going to faint.  You have cold sweats.  You have a fever.   This information is not intended to replace advice given to you by your health care provider. Make sure you discuss any  questions you have with your health care provider.   Document Released: 12/09/2005 Document Revised: 03/02/2012 Document Reviewed: 06/21/2015 Elsevier Interactive Patient Education 2016 Elsevier Inc.  

## 2016-02-24 NOTE — Progress Notes (Signed)
Subjective:  By signing my name below, I, [name], attest that this documentation has been prepared under the direction and in the presence of Norberto Sorenson, MD.  Electronically Signed: Andrew Au, ED Scribe. 02/24/2016. 11:15 AM.   Patient ID: Sean Watkins, male    DOB: 05/01/1966, 50 y.o.   MRN: [id number]  HPI Chief Complaint  Patient presents with  . 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 male with hx of allergies and asthma who [facility] complaining of worsening allergy symptoms that have worsened 3 days ago . He reports itchy watery eyes, sneezing, rhinorrhea, post nasal drip, frontal ST and occasional epistaxis. 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  He has not tried OTC medication due to not wanting to take a chance with his job. He would like a medication that is not sedative.  He denies pertinent medical hx. He denies fever and chills or feeling ill.   Past Medical History  Diagnosis Date  . Allergy   . Asthma   . Arthritis    History reviewed. No pertinent past surgical history. Prior to Admission medications   Medication Sig Start Date End Date Taking? Authorizing Provider  meloxicam (MOBIC) 15 MG tablet Take 1 tablet (15 mg total) by mouth daily. Patient not taking: Reported on 02/24/2016 05/13/15   Collie Siad English, PA   Review of Systems  Constitutional: Positive for fatigue. Negative for fever and chills.  HENT: Positive for congestion, nosebleeds, rhinorrhea, sinus pressure, sneezing and sore throat.   Eyes: Positive for discharge ( watery) and itching.   Objective:   Physical Exam  Constitutional: He is oriented to person, place, and time. He appears well-developed and well-nourished. No distress.  HENT:  Head: Normocephalic and atraumatic.  Right Ear:  Tympanic membrane is retracted.  Left Ear: Hearing and tympanic membrane normal.  Nose: Mucosal edema present.  Mouth/Throat: Posterior oropharyngeal erythema present.  Severe nasal edema and erythema with very friable septum bilaterally.   Eyes: Conjunctivae and EOM are normal.  Neck: Neck supple.  Cardiovascular: Normal rate, regular rhythm and normal heart sounds.   Pulmonary/Chest: Effort normal and breath sounds normal. No respiratory distress. He has no decreased breath sounds. He has no wheezes. He has no rhonchi. He has no rales. He exhibits no tenderness.  Musculoskeletal: Normal range of motion.  Neurological: He is alert and oriented to person, place, and time.  Skin: Skin is warm and dry.  Psychiatric: He has a normal mood and affect. His behavior is normal.  Nursing note and vitals reviewed.  Filed Vitals:   02/24/16 1019  BP: 122/70  Pulse: 73  Temp: 98.2 F (36.8 C)  TempSrc: Oral  Resp: 12  Height:  (1.676 m)  Weight: 173 lb (78.472 kg)  SpO2: 98%    Assessment & Plan:    1. Hay fever     Meds ordered this encounter  Medications  . desloratadine-pseudoephedrine (CLARINEX-D 24 HOUR) 5-240 MG 24 hr tablet    Sig: Take 1 tablet by mouth daily.    Dispense:  30 tablet    Refill:  5  . saline (AYR) GEL    Sig: Place 1 application into the nose every 4 (four) hours.    Dispense:  14 g  Refill:  2    I personally performed the services described in this documentation, which was scribed in my presence. The recorded information has been reviewed and considered, and addended by me as needed.  Norberto SorensonEva Tien Spooner, MD MPH

## 2016-11-08 ENCOUNTER — Telehealth: Payer: Self-pay

## 2016-11-08 ENCOUNTER — Ambulatory Visit (INDEPENDENT_AMBULATORY_CARE_PROVIDER_SITE_OTHER): Payer: Self-pay | Admitting: Urgent Care

## 2016-11-08 VITALS — BP 130/78 | HR 77 | Temp 98.7°F | Resp 16 | Ht 66.0 in | Wt 182.0 lb

## 2016-11-08 DIAGNOSIS — Z024 Encounter for examination for driving license: Secondary | ICD-10-CM

## 2016-11-08 NOTE — Telephone Encounter (Signed)
Pt is looking for lab results   Best number [phone]

## 2016-11-08 NOTE — Progress Notes (Signed)
Airline pilotCommercial Driver Medical Examination   Sean MoodyJuan Mangini is a 50 y.o. male who presents today for a DOT physical exam. Denies history of diabetes, HTN, HL, heart disease, mental illness. Denies dizziness, chronic headache, blurred vision, chest pain, shortness of breath, heart racing, palpitations, nausea, vomiting, abdominal pain, hematuria, lower leg swelling. Denies smoking cigarettes or drinking alcohol.   The following portions of the patient's history were reviewed and updated as appropriate: allergies, current medications, past family history, past medical history, past social history and past surgical history.  Objective:   BP 130/78 (BP Location: Right Arm, Patient Position: Sitting, Cuff Size: Normal)   Pulse 77   Temp 98.7 F (37.1 C)   Resp 16   Ht 5\' 6"  (1.676 m)   Wt 182 lb (82.6 kg)   SpO2 98%   BMI 29.38 kg/m   Vision/hearing: Vision Screening Comments: Peripheral Vision: Right eye 70 degrees. Left eye 70 degrees.  Patient can recognize and distinguish among traffic control signals and devices showing standard red, green, and amber colors.  Corrective lenses required: No  Monocular Vision?: No  Hearing aid requirement: No  Physical Exam  Constitutional: He is oriented to person, place, and time. He appears well-developed and well-nourished.  HENT:  TM's intact bilaterally, no effusions or erythema. Nasal turbinates pink and moist, nasal passages patent. No sinus tenderness. Oropharynx clear, mucous membranes moist, dentition in good repair.  Eyes: Conjunctivae and EOM are normal. Pupils are equal, round, and reactive to light. Right eye exhibits no discharge. Left eye exhibits no discharge. No scleral icterus.  Neck: Normal range of motion. Neck supple. No thyromegaly present.  Cardiovascular: Normal rate, regular rhythm and intact distal pulses.  Exam reveals no gallop and no friction rub.   No murmur heard. Pulmonary/Chest: No stridor. No respiratory  distress. He has no wheezes. He has no rales.  Abdominal: Soft. Bowel sounds are normal. He exhibits no distension and no mass. There is no tenderness.  Musculoskeletal: Normal range of motion. He exhibits no edema or tenderness.  Lymphadenopathy:    He has no cervical adenopathy.  Neurological: He is alert and oriented to person, place, and time. He has normal reflexes.  Skin: Skin is warm and dry. No rash noted. No erythema. No pallor.  Psychiatric: He has a normal mood and affect.   Labs: Comments: SG: 1.010, PRO: Neg, BLO: Neg, GLU: Neg  Assessment:    Healthy male exam.  Meets standards in 3549 CFR 391.41;  qualifies for 2 year certificate.    Plan:   Medical examiners certificate completed and printed. Return as needed.

## 2016-11-08 NOTE — Patient Instructions (Addendum)

## 2016-11-13 NOTE — Telephone Encounter (Signed)
Pt was just here for DOT, no labs done? Called pt who clarified that he was curious about the SG # on the urine strip results. He wondered why there was a number next to it and if it was normal. I advised him that a number is supposed to be there and his result was in normal range. Pt voiced understanding.

## 2017-02-17 ENCOUNTER — Encounter: Payer: Self-pay | Admitting: Family Medicine

## 2017-02-17 ENCOUNTER — Ambulatory Visit (INDEPENDENT_AMBULATORY_CARE_PROVIDER_SITE_OTHER): Payer: BLUE CROSS/BLUE SHIELD | Admitting: Family Medicine

## 2017-02-17 VITALS — BP 122/82 | HR 53 | Temp 98.6°F | Resp 16 | Ht 65.5 in | Wt 176.2 lb

## 2017-02-17 DIAGNOSIS — B029 Zoster without complications: Secondary | ICD-10-CM

## 2017-02-17 DIAGNOSIS — R21 Rash and other nonspecific skin eruption: Secondary | ICD-10-CM | POA: Diagnosis not present

## 2017-02-17 DIAGNOSIS — J309 Allergic rhinitis, unspecified: Secondary | ICD-10-CM

## 2017-02-17 MED ORDER — FLUTICASONE PROPIONATE 50 MCG/ACT NA SUSP: 1.0000 | Freq: Every day | NASAL | 6 refills | Status: DC

## 2017-02-17 MED ORDER — VALACYCLOVIR HCL 1 G PO TABS: 1000.0000 mg | ORAL_TABLET | Freq: Three times a day (TID) | ORAL | 0 refills | Status: DC

## 2017-02-17 NOTE — Patient Instructions (Addendum)
You rash appears to be due to shingles.  Can try valtrex, but it may be beyond the time for that to help.  Ibuprofen or tylenol as needed, but let me know if you need something stronger.   For allergies, continue Claritin once per day. I sent a prescription for Flonase nasal spray to be used 1-2 sprays in each nostril once per day. If any worsening symptoms, return for recheck.  Allergic Rhinitis Allergic rhinitis is when the mucous membranes in the nose respond to allergens. Allergens are particles in the air that cause your body to have an allergic reaction. This causes you to release allergic antibodies. Through a chain of events, these eventually cause you to release histamine into the blood stream. Although meant to protect the body, it is this release of histamine that causes your discomfort, such as frequent sneezing, congestion, and an itchy, runny nose. What are the causes? Seasonal allergic rhinitis (hay fever) is caused by pollen allergens that may come from grasses, trees, and weeds. Year-round allergic rhinitis (perennial allergic rhinitis) is caused by allergens such as house dust mites, pet dander, and mold spores. What are the signs or symptoms?  Nasal stuffiness (congestion).  Itchy, runny nose with sneezing and tearing of the eyes. How is this diagnosed? Your health care provider can help you determine the allergen or allergens that trigger your symptoms. If you and your health care provider are unable to determine the allergen, skin or blood testing may be used. Your health care provider will diagnose your condition after taking your health history and performing a physical exam. Your health care provider may assess you for other related conditions, such as asthma, pink eye, or an ear infection. How is this treated? Allergic rhinitis does not have a cure, but it can be controlled by:  Medicines that block allergy symptoms. These may include allergy shots, nasal sprays, and oral  antihistamines.  Avoiding the allergen. Hay fever may often be treated with antihistamines in pill or nasal spray forms. Antihistamines block the effects of histamine. There are over-the-counter medicines that may help with nasal congestion and swelling around the eyes. Check with your health care provider before taking or giving this medicine. If avoiding the allergen or the medicine prescribed do not work, there are many new medicines your health care provider can prescribe. Stronger medicine may be used if initial measures are ineffective. Desensitizing injections can be used if medicine and avoidance does not work. Desensitization is when a patient is given ongoing shots until the body becomes less sensitive to the allergen. Make sure you follow up with your health care provider if problems continue. Follow these instructions at home: It is not possible to completely avoid allergens, but you can reduce your symptoms by taking steps to limit your exposure to them. It helps to know exactly what you are allergic to so that you can avoid your specific triggers. Contact a health care provider if:  You have a fever.  You develop a cough that does not stop easily (persistent).  You have shortness of breath.  You start wheezing.  Symptoms interfere with normal daily activities. This information is not intended to replace advice given to you by your health care provider. Make sure you discuss any questions you have with your health care provider. Document Released: 09/03/2001 Document Revised: 08/09/2016 Document Reviewed: 08/16/2013 Elsevier Interactive Patient Education  2017 Elsevier Inc.    Shingles Shingles, which is also known as herpes zoster, is an infection  that causes a painful skin rash and fluid-filled blisters. Shingles is not related to genital herpes, which is a sexually transmitted infection. Shingles only develops in people who:  Have had chickenpox.  Have received the  chickenpox vaccine. (This is rare.) What are the causes? Shingles is caused by varicella-zoster virus (VZV). This is the same virus that causes chickenpox. After exposure to VZV, the virus stays in the body in an inactive (dormant) state. Shingles develops if the virus reactivates. This can happen many years after the initial exposure to VZV. It is not known what causes this virus to reactivate. What increases the risk? People who have had chickenpox or received the chickenpox vaccine are at risk for shingles. Infection is more common in people who:  Are older than age 550.  Have a weakened defense (immune) system, such as those with HIV, AIDS, or cancer.  Are taking medicines that weaken the immune system, such as transplant medicines.  Are under great stress. What are the signs or symptoms? Early symptoms of this condition include itching, tingling, and pain in an area on your skin. Pain may be described as burning, stabbing, or throbbing. A few days or weeks after symptoms start, a painful red rash appears, usually on one side of the body in a bandlike or beltlike pattern. The rash eventually turns into fluid-filled blisters that break open, scab over, and dry up in about 2-3 weeks. At any time during the infection, you may also develop:  A fever.  Chills.  A headache.  An upset stomach. How is this diagnosed? This condition is diagnosed with a skin exam. Sometimes, skin or fluid samples are taken from the blisters before a diagnosis is made. These samples are examined under a microscope or sent to a lab for testing. How is this treated? There is no specific cure for this condition. Your health care provider will probably prescribe medicines to help you manage pain, recover more quickly, and avoid long-term problems. Medicines may include:  Antiviral drugs.  Anti-inflammatory drugs.  Pain medicines. If the area involved is on your face, you may be referred to a specialist, such  as an eye doctor (ophthalmologist) or an ear, nose, and throat (ENT) doctor to help you avoid eye problems, chronic pain, or disability. Follow these instructions at home: Medicines  Take medicines only as directed by your health care provider.  Apply an anti-itch or numbing cream to the affected area as directed by your health care provider. Blister and Rash Care  Take a cool bath or apply cool compresses to the area of the rash or blisters as directed by your health care provider. This may help with pain and itching.  Keep your rash covered with a loose bandage (dressing). Wear loose-fitting clothing to help ease the pain of material rubbing against the rash.  Keep your rash and blisters clean with mild soap and cool water or as directed by your health care provider.  Check your rash every day for signs of infection. These include redness, swelling, and pain that lasts or increases.  Do not pick your blisters.  Do not scratch your rash. General instructions  Rest as directed by your health care provider.  Keep all follow-up visits as directed by your health care provider. This is important.  Until your blisters scab over, your infection can cause chickenpox in people who have never had it or been vaccinated against it. To prevent this from happening, avoid contact with other people, especially:  Babies.  Pregnant women.  Children who have eczema.  Elderly people who have transplants.  People who have chronic illnesses, such as leukemia or AIDS. Contact a health care provider if:  Your pain is not relieved with prescribed medicines.  Your pain does not get better after the rash heals.  Your rash looks infected. Signs of infection include redness, swelling, and pain that lasts or increases. Get help right away if:  The rash is on your face or nose.  You have facial pain, pain around your eye area, or loss of feeling on one side of your face.  You have ear pain or you  have ringing in your ear.  You have loss of taste.  Your condition gets worse. This information is not intended to replace advice given to you by your health care provider. Make sure you discuss any questions you have with your health care provider. Document Released: 12/09/2005 Document Revised: 08/04/2016 Document Reviewed: 10/20/2014 Elsevier Interactive Patient Education  2017 [employer].   IF you received an x-ray today, you will receive an invoice from [facility]. Please contact [facility] at [phone] with questions or concerns regarding your invoice.   IF you received labwork today, you will receive an invoice from [facility]. Please contact [facility] at [phone] with questions or concerns regarding your invoice.   Our billing staff will not be able to assist you with questions regarding bills from these companies.  You will be contacted with the lab results as soon as they are available. The fastest way to get your results is to activate your My Chart account. Instructions are located on the last page of this paperwork. If you have not heard from [place] regarding the results in 2 weeks, please contact this office.

## 2017-02-17 NOTE — Progress Notes (Signed)
By signing my name below, I, [name], attest that this documentation has been prepared under the direction and in the presence of [name], MD.  Electronically Signed: [name], Medical Scribe. 02/17/17. 8:15 AM.  Subjective:    Patient ID: Sean Watkins, male    DOB: 1966-12-13, 52 y.o.   MRN: [id number]  HPI Chief Complaint  Patient presents with  . Rash    right shoulder x 5 days, itchy , painful    HPI Comments: Sean Watkins is a 51 y.o. male who presents to the [facility] complaining of rash on his right shoulder onset 5 days ago. The rash started on the top of his shoulder and it has radiated to his back and now to his upper neck. Describes his rash as itchy, painful, burning, and stinging so he's been scratching it. He states it's keeping him up and it feeling like his muscles are being pulled. Pt used a cream yesterday without relief to his sxs. Reports having chicken pox as a child. Denies rash anywhere else on his body.  Seasonal Allergies: Previous note was reviewed from March 2017. A week ago, pt began sneezing uncontrollably with rhinorrhea and mentions he has seasonal allergies. Pt took claritin D for relief of his sxs in the past. Denies fevers.  Patient Active Problem List   Diagnosis Date Noted  . Gout attack 07/16/2013   Past Medical History:  Diagnosis Date  . Allergy   . Arthritis   . Asthma    No past surgical history on file. Allergies  Allergen Reactions  . Indocin [Indomethacin] Rash    POSSIBLY due to indocin   Prior to Admission medications   Medication Sig Start Date End Date Taking? Authorizing Provider  loratadine (CLARITIN) 10 MG tablet Take 10 mg by mouth daily.   Yes Historical Provider, MD   Social History   Social History  . Marital status: Single    Spouse name: N/A  . Number of children: N/A  . Years of education: N/A   Occupational History  . Not on file.   Social History Main Topics    . Smoking status: Never Smoker  . Smokeless tobacco: Never Used  . Alcohol use No  . Drug use: No  . Sexual activity: Yes    Birth control/ protection: None   Other Topics Concern  . Not on file   Social History Narrative  . No narrative on file   Review of Systems  Constitutional: Negative for fever.  HENT: Positive for rhinorrhea and sneezing.   Musculoskeletal: Positive for myalgias.  Skin: Positive for color change and rash. Negative for pallor.  Allergic/Immunologic: Negative for environmental allergies.    Objective:  Physical Exam  Constitutional: He is oriented to person, place, and time. He appears well-developed and well-nourished. No distress.  HENT:  Head: Normocephalic and atraumatic.  Right Ear: Tympanic membrane, external ear and ear canal normal.  Left Ear: Tympanic membrane, external ear and ear canal normal.  Nose: No rhinorrhea.  Mouth/Throat: Oropharynx is clear and moist and mucous membranes are normal. No oropharyngeal exudate or posterior oropharyngeal erythema.  Eyes: Conjunctivae are normal. Pupils are equal, round, and reactive to light.  Neck: Neck supple.  Cardiovascular: Normal rate, regular rhythm, normal heart sounds and intact distal pulses.  Exam reveals no friction rub.   No murmur heard. Pulmonary/Chest: Effort normal and breath sounds normal. No respiratory distress. He has no wheezes. He has no rhonchi. He has  no rales.  Abdominal: Soft. There is no tenderness.  Lymphadenopathy:    He has no cervical adenopathy.  Neurological: He is alert and oriented to person, place, and time.  Skin: Skin is warm and dry. No rash noted.  Multiple patches across the upper right chest dorsal shoulder Lateral right neck and upper arm that are erythematous with overlying vesicles No lower arm rash Does not cross midline  Psychiatric: He has a normal mood and affect. His behavior is normal.  Nursing note and vitals reviewed.   Vitals:   02/17/17 0807   BP: 122/82  Pulse: (!) 53  Resp: 16  Temp: 98.6 F (37 C)  TempSrc: Oral  SpO2: 98%  Weight: 176 lb 3.2 oz (79.9 kg)  Height: 5' 5.5" (1.664 m)  Body mass index is 28.88 kg/m. Assessment & Plan:    Sean Watkins is a 51 y.o. male Herpes zoster without complication - Plan: valACYclovir (VALTREX) 1000 MG tablet, Care order/instruction: Rash and nonspecific skin eruption - Plan: valACYclovir (VALTREX) 1000 MG tablet  - Rash appears to be herpes zoster. Approximately 5 days into symptoms, so discussed questionable likelihood of Valtrex changing course, but will initially try Valtrex 1 g 3 times a day for 7 days. Symptomatic care discussed with Tylenol or ibuprofen, he declined any stronger pain medication. RTC precautions discussed.  Acute allergic rhinitis, unspecified seasonality, unspecified trigger - Plan: fluticasone (FLONASE) 50 MCG/ACT nasal spray  - Handout given, continue over-the-counter antihistamine Claritin, prescription for Flonase nasal spray provided. RTC precautions  Meds ordered this encounter  Medications  . loratadine (CLARITIN) 10 MG tablet    Sig: Take 10 mg by mouth daily.  . valACYclovir (VALTREX) 1000 MG tablet    Sig: Take 1 tablet (1,000 mg total) by mouth 3 (three) times daily.    Dispense:  21 tablet    Refill:  0  . fluticasone (FLONASE) 50 MCG/ACT nasal spray    Sig: Place 1-2 sprays into both nostrils daily.    Dispense:  16 g    Refill:  6   Patient Instructions    You rash appears to be due to shingles.  Can try valtrex, but it may be beyond the time for that to help.  Ibuprofen or tylenol as needed, but let me know if you need something stronger.   For allergies, continue Claritin once per day. I sent a prescription for Flonase nasal spray to be used 1-2 sprays in each nostril once per day. If any worsening symptoms, return for recheck.  Allergic Rhinitis Allergic rhinitis is when the mucous membranes in the nose respond to allergens.  Allergens are particles in the air that cause your body to have an allergic reaction. This causes you to release allergic antibodies. Through a chain of events, these eventually cause you to release histamine into the blood stream. Although meant to protect the body, it is this release of histamine that causes your discomfort, such as frequent sneezing, congestion, and an itchy, runny nose. What are the causes? Seasonal allergic rhinitis (hay fever) is caused by pollen allergens that may come from grasses, trees, and weeds. Year-round allergic rhinitis (perennial allergic rhinitis) is caused by allergens such as house dust mites, pet dander, and mold spores. What are the signs or symptoms?  Nasal stuffiness (congestion).  Itchy, runny nose with sneezing and tearing of the eyes. How is this diagnosed? Your health care provider can help you determine the allergen or allergens that trigger your symptoms. If you  and your health care provider are unable to determine the allergen, skin or blood testing may be used. Your health care provider will diagnose your condition after taking your health history and performing a physical exam. Your health care provider may assess you for other related conditions, such as asthma, pink eye, or an ear infection. How is this treated? Allergic rhinitis does not have a cure, but it can be controlled by:  Medicines that block allergy symptoms. These may include allergy shots, nasal sprays, and oral antihistamines.  Avoiding the allergen. Hay fever may often be treated with antihistamines in pill or nasal spray forms. Antihistamines block the effects of histamine. There are over-the-counter medicines that may help with nasal congestion and swelling around the eyes. Check with your health care provider before taking or giving this medicine. If avoiding the allergen or the medicine prescribed do not work, there are many new medicines your health care provider can prescribe.  Stronger medicine may be used if initial measures are ineffective. Desensitizing injections can be used if medicine and avoidance does not work. Desensitization is when a patient is given ongoing shots until the body becomes less sensitive to the allergen. Make sure you follow up with your health care provider if problems continue. Follow these instructions at home: It is not possible to completely avoid allergens, but you can reduce your symptoms by taking steps to limit your exposure to them. It helps to know exactly what you are allergic to so that you can avoid your specific triggers. Contact a health care provider if:  You have a fever.  You develop a cough that does not stop easily (persistent).  You have shortness of breath.  You start wheezing.  Symptoms interfere with normal daily activities. This information is not intended to replace advice given to you by your health care provider. Make sure you discuss any questions you have with your health care provider. Document Released: 09/03/2001 Document Revised: 08/09/2016 Document Reviewed: 08/16/2013 Elsevier Interactive Patient Education  2017 Elsevier Inc.    Shingles Shingles, which is also known as herpes zoster, is an infection that causes a painful skin rash and fluid-filled blisters. Shingles is not related to genital herpes, which is a sexually transmitted infection. Shingles only develops in people who:  Have had chickenpox.  Have received the chickenpox vaccine. (This is rare.) What are the causes? Shingles is caused by varicella-zoster virus (VZV). This is the same virus that causes chickenpox. After exposure to VZV, the virus stays in the body in an inactive (dormant) state. Shingles develops if the virus reactivates. This can happen many years after the initial exposure to VZV. It is not known what causes this virus to reactivate. What increases the risk? People who have had chickenpox or received the chickenpox  vaccine are at risk for shingles. Infection is more common in people who:  Are older than age 32.  Have a weakened defense (immune) system, such as those with HIV, AIDS, or cancer.  Are taking medicines that weaken the immune system, such as transplant medicines.  Are under great stress. What are the signs or symptoms? Early symptoms of this condition include itching, tingling, and pain in an area on your skin. Pain may be described as burning, stabbing, or throbbing. A few days or weeks after symptoms start, a painful red rash appears, usually on one side of the body in a bandlike or beltlike pattern. The rash eventually turns into fluid-filled blisters that break open, scab over, and  dry up in about 2-3 weeks. At any time during the infection, you may also develop:  A fever.  Chills.  A headache.  An upset stomach. How is this diagnosed? This condition is diagnosed with a skin exam. Sometimes, skin or fluid samples are taken from the blisters before a diagnosis is made. These samples are examined under a microscope or sent to a lab for testing. How is this treated? There is no specific cure for this condition. Your health care provider will probably prescribe medicines to help you manage pain, recover more quickly, and avoid long-term problems. Medicines may include:  Antiviral drugs.  Anti-inflammatory drugs.  Pain medicines. If the area involved is on your face, you may be referred to a specialist, such as an eye doctor (ophthalmologist) or an ear, nose, and throat (ENT) doctor to help you avoid eye problems, chronic pain, or disability. Follow these instructions at home: Medicines  Take medicines only as directed by your health care provider.  Apply an anti-itch or numbing cream to the affected area as directed by your health care provider. Blister and Rash Care  Take a cool bath or apply cool compresses to the area of the rash or blisters as directed by your health care  provider. This may help with pain and itching.  Keep your rash covered with a loose bandage (dressing). Wear loose-fitting clothing to help ease the pain of material rubbing against the rash.  Keep your rash and blisters clean with mild soap and cool water or as directed by your health care provider.  Check your rash every day for signs of infection. These include redness, swelling, and pain that lasts or increases.  Do not pick your blisters.  Do not scratch your rash. General instructions  Rest as directed by your health care provider.  Keep all follow-up visits as directed by your health care provider. This is important.  Until your blisters scab over, your infection can cause chickenpox in people who have never had it or been vaccinated against it. To prevent this from happening, avoid contact with other people, especially:  Babies.  Pregnant women.  Children who have eczema.  Elderly people who have transplants.  People who have chronic illnesses, such as leukemia or AIDS. Contact a health care provider if:  Your pain is not relieved with prescribed medicines.  Your pain does not get better after the rash heals.  Your rash looks infected. Signs of infection include redness, swelling, and pain that lasts or increases. Get help right away if:  The rash is on your face or nose.  You have facial pain, pain around your eye area, or loss of feeling on one side of your face.  You have ear pain or you have ringing in your ear.  You have loss of taste.  Your condition gets worse. This information is not intended to replace advice given to you by your health care provider. Make sure you discuss any questions you have with your health care provider. Document Released: 12/09/2005 Document Revised: 08/04/2016 Document Reviewed: 10/20/2014 Elsevier Interactive Patient Education  2017 [employer].   IF you received an x-ray today, you will receive an invoice from [facility]  Radiology. Please contact [facility] at [phone] with questions or concerns regarding your invoice.   IF you received labwork today, you will receive an invoice from [facility]. Please contact [facility] at [phone] with questions or concerns regarding your invoice.   Our billing staff will not be able to assist  you with questions regarding bills from these companies.  You will be contacted with the lab results as soon as they are available. The fastest way to get your results is to activate your My Chart account. Instructions are located on the last page of this paperwork. If you have not heard from us regarding the results in 2 weeks, please contact this office.      I personally performed the services described in this documentation, which was scribed in my presence. The recorded information has been reviewed and considered for accuracy and completeness, addended by me as needed, and agree with information above.  Signed,   Meredith StaggersJeffrey Elyshia Kumagai, MD [facility].  02/17/17 8:28 AM

## 2017-04-28 ENCOUNTER — Encounter: Payer: Self-pay | Admitting: Urgent Care

## 2017-04-28 ENCOUNTER — Ambulatory Visit (INDEPENDENT_AMBULATORY_CARE_PROVIDER_SITE_OTHER): Payer: BLUE CROSS/BLUE SHIELD | Admitting: Urgent Care

## 2017-04-28 VITALS — BP 120/80 | HR 55 | Temp 98.1°F | Resp 16 | Ht 65.5 in | Wt 174.8 lb

## 2017-04-28 DIAGNOSIS — R21 Rash and other nonspecific skin eruption: Secondary | ICD-10-CM

## 2017-04-28 LAB — POCT WET + KOH PREP
TRICH BY WET PREP: ABSENT
Yeast by KOH: ABSENT
Yeast by wet prep: ABSENT

## 2017-04-28 LAB — POCT SKIN KOH: Skin KOH, POC: NEGATIVE

## 2017-04-28 MED ORDER — KETOCONAZOLE 2 % EX CREA: 1.0000 "application " | TOPICAL_CREAM | Freq: Every day | CUTANEOUS | 0 refills | Status: DC

## 2017-04-28 MED ORDER — RANITIDINE HCL 150 MG PO TABS: 150.0000 mg | ORAL_TABLET | Freq: Two times a day (BID) | ORAL | 0 refills | Status: DC

## 2017-04-28 MED ORDER — CETIRIZINE HCL 10 MG PO TABS: 10.0000 mg | ORAL_TABLET | Freq: Every day | ORAL | 11 refills | Status: DC

## 2017-04-28 NOTE — Patient Instructions (Addendum)
Para el sarpullido de tus brazos y cuerpo toma Zyrtec 10mg  una ves diaramente y Zantac 150mg  dos veces diaramente por una semana. Deja de tomar todos los medicamentos que estas tomando [place]. Para el sarpullido de los genitales [place] la pumada por 2 semanas.     IF you received an x-ray today, you will receive an invoice from [facility]. Please contact [facility] at [phone] with questions or concerns regarding your invoice.   IF you received labwork today, you will receive an invoice from [facility]. Please contact [facility] at [phone] with questions or concerns regarding your invoice.   Our billing staff will not be able to assist you with questions regarding bills from these companies.  You will be contacted with the lab results as soon as they are available. The fastest way to get your results is to activate your My Chart account. Instructions are located on the last page of this paperwork. If you have not heard from us regarding the results in 2 weeks, please contact this office.

## 2017-04-28 NOTE — Progress Notes (Signed)
  MRN: [id number] DOB: [birth year]  Subjective:   Sean Watkins is a 51 y.o. male presenting for chief complaint of Rash (states this is an ongoing issue; would like to have an referral to be seen by an Dermatologist)  Reports 2 week history of pruritic, stinging rash over his arms bilaterally. Last OV was 02/17/2017, patient was treated for shingles with Valtrex which he completed in 1 week. Since then, he denies having done yard work, eating new foods, switching hygiene products. Denies fever, drainage of pus or bleeding, sore throat, cough, n/v, malaise. He has used an otc topical cream for his current rash with minimal improvement. He has also had an itchy and scaly rash over the genital area for the better part of a year. Denies dysuria, hematuria, urinary frequency, penile discharge, penile swelling, testicular pain, testicular swelling, anal pain, groin pain.   Sean Watkins has a current medication list which includes the following prescription(s): fluticasone and valacyclovir. Also is allergic to indocin [indomethacin].  Sean Watkins  has a past medical history of Allergy; Arthritis; and Asthma. Also  has no past surgical history on file.  Objective:   Vitals: BP 120/80   Pulse (!) 55   Temp 98.1 F (36.7 C) (Oral)   Resp 16   Ht 5' 5.5" (1.664 m)   Wt 174 lb 12.8 oz (79.3 kg)   SpO2 97%   BMI 28.65 kg/m   Physical Exam  Constitutional: He is oriented to person, place, and time. He appears well-developed and well-nourished.  Cardiovascular: Normal rate, regular rhythm and intact distal pulses.  Exam reveals no gallop and no friction rub.   No murmur heard. Pulmonary/Chest: Effort normal. No respiratory distress. He has no wheezes. He has no rales.  Neurological: He is alert and oriented to person, place, and time.  Skin: Skin is warm and dry.      Results for orders placed or performed in visit on 04/28/17 (from the past 24 hour(s))  POCT Wet + KOH Prep     Status: Abnormal   Collection  Time: 04/28/17  8:36 AM  Result Value Ref Range   Yeast by KOH Absent Absent   Yeast by wet prep Absent Absent   WBC by wet prep None (A) Few   Clue Cells Wet Prep HPF POC None None   Trich by wet prep Absent Absent   Bacteria Wet Prep HPF POC None (A) Few   Epithelial Cells By [employer] Pref ([facility]) Few None, Few, Too numerous to count   RBC,UR,HPF,POC None None RBC/hpf  POCT Skin KOH     Status: None   Collection Time: 04/28/17  8:45 AM  Result Value Ref Range   Skin KOH, POC Negative Negative   Assessment and Plan :   1. Rash and nonspecific skin eruption - Appears allergic in nature, d/c all medications. Avoid doing yardwork. Start Zyrtec with Zantac, will hold off on steroid course for now given treatment for #2 below. - POCT Wet + KOH Prep - POCT Skin KOH  2. Rash of genital area - Will managed as fungal infection. - ketoconazole (NIZORAL) 2 % cream; Apply 1 application topically daily.  Dispense: 30 g; Refill: 0   Wallis BambergMario Louis Gaw, PA-C [facility] at [facility] [phone] 04/28/2017  8:31 AM

## 2018-03-11 ENCOUNTER — Ambulatory Visit (INDEPENDENT_AMBULATORY_CARE_PROVIDER_SITE_OTHER): Payer: BLUE CROSS/BLUE SHIELD | Admitting: Emergency Medicine

## 2018-03-11 ENCOUNTER — Ambulatory Visit (HOSPITAL_COMMUNITY)
Admission: EM | Admit: 2018-03-11 | Discharge: 2018-03-11 | Disposition: A | Discharge status: Other Acute Inpatient Hospital | Payer: Self-pay

## 2018-03-11 ENCOUNTER — Other Ambulatory Visit: Payer: Self-pay

## 2018-03-11 ENCOUNTER — Encounter: Payer: Self-pay | Admitting: Emergency Medicine

## 2018-03-11 ENCOUNTER — Ambulatory Visit: Payer: BLUE CROSS/BLUE SHIELD

## 2018-03-11 VITALS — BP 129/79 | HR 81 | Temp 98.7°F | Resp 16 | Ht 66.0 in | Wt 178.4 lb

## 2018-03-11 DIAGNOSIS — Z0000 Encounter for general adult medical examination without abnormal findings: Secondary | ICD-10-CM | POA: Diagnosis not present

## 2018-03-11 NOTE — Patient Instructions (Addendum)
   IF you received an x-ray today, you will receive an invoice from [facility]. Please contact [facility] at [phone] with questions or concerns regarding your invoice.   IF you received labwork today, you will receive an invoice from [facility]. Please contact [facility] at [phone] with questions or concerns regarding your invoice.   Our billing staff will not be able to assist you with questions regarding bills from these companies.  You will be contacted with the lab results as soon as they are available. The fastest way to get your results is to activate your My Chart account. Instructions are located on the last page of this paperwork. If you have not heard from us regarding the results in 2 weeks, please contact this office.      Health Maintenance, Male A healthy lifestyle and preventive care is important for your health and wellness. Ask your health care provider about what schedule of regular examinations is right for you. What should I know about weight and diet? Eat a Healthy Diet  Eat plenty of vegetables, fruits, whole grains, low-fat dairy products, and lean protein.  Do not eat a lot of foods high in solid fats, added sugars, or salt.  Maintain a Healthy Weight Regular exercise can help you achieve or maintain a healthy weight. You should:  Do at least 150 minutes of exercise each week. The exercise should increase your heart rate and make you sweat (moderate-intensity exercise).  Do strength-training exercises at least twice a week.  Watch Your Levels of Cholesterol and Blood Lipids  Have your blood tested for lipids and cholesterol every 5 years starting at 51 years of age. If you are at high risk for heart disease, you should start having your blood tested when you are 51 years old. You may need to have your cholesterol levels checked more often if: ? Your lipid or cholesterol levels are high. ? You are older than 50 years of age. ? You  are at high risk for heart disease.  What should I know about cancer screening? Many types of cancers can be detected early and may often be prevented. Lung Cancer  You should be screened every year for lung cancer if: ? You are a current smoker who has smoked for at least 30 years. ? You are a former smoker who has quit within the past 15 years.  Talk to your health care provider about your screening options, when you should start screening, and how often you should be screened.  Colorectal Cancer  Routine colorectal cancer screening usually begins at 50 years of age and should be repeated every 5-10 years until you are 51 years old. You may need to be screened more often if early forms of precancerous polyps or small growths are found. Your health care provider may recommend screening at an earlier age if you have risk factors for colon cancer.  Your health care provider may recommend using home test kits to check for hidden blood in the stool.  A small camera at the end of a tube can be used to examine your colon (sigmoidoscopy or colonoscopy). This checks for the earliest forms of colorectal cancer.  Prostate and Testicular Cancer  Depending on your age and overall health, your health care provider may do certain tests to screen for prostate and testicular cancer.  Talk to your health care provider about any symptoms or concerns you have about testicular or prostate cancer.  Skin Cancer  Check your skin   from head to toe regularly.  Tell your health care provider about any new moles or changes in moles, especially if: ? There is a change in a mole's size, shape, or color. ? You have a mole that is larger than a pencil eraser.  Always use sunscreen. Apply sunscreen liberally and repeat throughout the day.  Protect yourself by wearing long sleeves, pants, a wide-brimmed hat, and sunglasses when outside.  What should I know about heart disease, diabetes, and high blood  pressure?  If you are 18-39 years of age, have your blood pressure checked every 3-5 years. If you are 40 years of age or older, have your blood pressure checked every year. You should have your blood pressure measured twice-once when you are at a hospital or clinic, and once when you are not at a hospital or clinic. Record the average of the two measurements. To check your blood pressure when you are not at a hospital or clinic, you can use: ? An automated blood pressure machine at a pharmacy. ? A home blood pressure monitor.  Talk to your health care provider about your target blood pressure.  If you are between 45-79 years old, ask your health care provider if you should take aspirin to prevent heart disease.  Have regular diabetes screenings by checking your fasting blood sugar level. ? If you are at a normal weight and have a low risk for diabetes, have this test once every three years after the age of 45. ? If you are overweight and have a high risk for diabetes, consider being tested at a younger age or more often.  A one-time screening for abdominal aortic aneurysm (AAA) by ultrasound is recommended for men aged 65-75 years who are current or former smokers. What should I know about preventing infection? Hepatitis B If you have a higher risk for hepatitis B, you should be screened for this virus. Talk with your health care provider to find out if you are at risk for hepatitis B infection. Hepatitis C Blood testing is recommended for:  Everyone born from 1945 through 1965.  Anyone with known risk factors for hepatitis C.  Sexually Transmitted Diseases (STDs)  You should be screened each year for STDs including gonorrhea and chlamydia if: ? You are sexually active and are younger than 51 years of age. ? You are older than 51 years of age and your health care provider tells you that you are at risk for this type of infection. ? Your sexual activity has changed since you were last  screened and you are at an increased risk for chlamydia or gonorrhea. Ask your health care provider if you are at risk.  Talk with your health care provider about whether you are at high risk of being infected with HIV. Your health care provider may recommend a prescription medicine to help prevent HIV infection.  What else can I do?  Schedule regular health, dental, and eye exams.  Stay current with your vaccines (immunizations).  Do not use any tobacco products, such as cigarettes, chewing tobacco, and e-cigarettes. If you need help quitting, ask your health care provider.  Limit alcohol intake to no more than 2 drinks per day. One drink equals 12 ounces of beer, 5 ounces of wine, or 1 ounces of hard liquor.  Do not use street drugs.  Do not share needles.  Ask your health care provider for help if you need support or information about quitting drugs.  Tell your health care   provider if you often feel depressed.  Tell your health care provider if you have ever been abused or do not feel safe at home. This information is not intended to replace advice given to you by your health care provider. Make sure you discuss any questions you have with your health care provider. Document Released: 06/06/2008 Document Revised: 08/07/2016 Document Reviewed: 09/12/2015 Elsevier Interactive Patient Education  2018 [employer].  American Heart Association (AHA) Exercise Recommendation  Being physically active is important to prevent heart disease and stroke, the nation's No. 1and No. 5killers. To improve overall cardiovascular health, we suggest at least 150 minutes per week of moderate exercise or 75 minutes per week of vigorous exercise (or a combination of moderate and vigorous activity). Thirty minutes a day, five times a week is an easy goal to remember. You will also experience benefits even if you divide your time into two or three segments of 10 to 15 minutes per day.  For people who would  benefit from lowering their blood pressure or cholesterol, we recommend 40 minutes of aerobic exercise of moderate to vigorous intensity three to four times a week to lower the risk for heart attack and stroke.  Physical activity is anything that makes you move your body and burn calories.  This includes things like climbing stairs or playing sports. Aerobic exercises benefit your heart, and include walking, jogging, swimming or biking. Strength and stretching exercises are best for overall stamina and flexibility.  The simplest, positive change you can make to effectively improve your heart health is to start walking. It's enjoyable, free, easy, social and great exercise. A walking program is flexible and boasts high success rates because people can stick with it. It's easy for walking to become a regular and satisfying part of life.   For Overall Cardiovascular Health:  At least 30 minutes of moderate-intensity aerobic activity at least 5 days per week for a total of 150  OR   At least 25 minutes of vigorous aerobic activity at least 3 days per week for a total of 75 minutes; or a combination of moderate- and vigorous-intensity aerobic activity  AND   Moderate- to high-intensity muscle-strengthening activity at least 2 days per week for additional health benefits.  For Lowering Blood Pressure and Cholesterol  An average 40 minutes of moderate- to vigorous-intensity aerobic activity 3 or 4 times per week  What if I can't make it to the time goal? Something is always better than nothing! And everyone has to start somewhere. Even if you've been sedentary for years, today is the day you can begin to make healthy changes in your life. If you don't think you'll make it for 30 or 40 minutes, set a reachable goal for today. You can work up toward your overall goal by increasing your time as you get stronger. Don't let all-or-nothing thinking rob you of doing what you can every day.   Source:http://www.heart.org    

## 2018-03-11 NOTE — Progress Notes (Signed)
Sean Watkins 52 y.o.   Chief Complaint  Patient presents with  . Annual Exam  . Referral    Cardiology and Headache specialist    HISTORY OF PRESENT ILLNESS: This is a 52 y.o. male Here for annual exam; no complaints and no medical concerns.  Smoking: No Drinking: Occasional beer. 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 Exercise: Not enough. Stress: Eating Nutrition: Adequate Drugs: No  HPI   Prior to Admission medications   Medication Sig Start Date End Date Taking? Authorizing Provider  cetirizine (ZYRTEC) 10 MG tablet Take 1 tablet (10 mg total) by mouth daily. Patient not taking: Reported on 03/11/2018 04/28/17   Sean Bamberg, PA-C  fluticasone [facility]) 50 MCG/ACT nasal spray Place 1-2 sprays into both nostrils daily. Patient not taking: Reported on 04/28/2017 02/17/17   Sean Flood, MD  ketoconazole (NIZORAL) 2 % cream Apply 1 application topically daily. Patient not taking: Reported on 03/11/2018 04/28/17   Sean Bamberg, PA-C  ranitidine (ZANTAC) 150 MG tablet Take 1 tablet (150 mg total) by mouth 2 (two) times daily. Patient not taking: Reported on 03/11/2018 04/28/17   Sean Bamberg, PA-C  valACYclovir (VALTREX) 1000 MG tablet Take 1 tablet (1,000 mg total) by mouth 3 (three) times daily. Patient not taking: Reported on 04/28/2017 02/17/17   Sean Flood, MD    Allergies  Allergen Reactions  . Indocin [Indomethacin] Rash    POSSIBLY due to indocin    Patient Active Problem List   Diagnosis Date Noted  . Gout attack 07/16/2013    Past Medical History:  Diagnosis Date  . Allergy   . Arthritis   . Asthma     No past surgical history on file.  Social History   Socioeconomic History  . Marital status: Single    Spouse name: Not on file  . Number of children: Not on file  . Years of education: Not on file  . Highest education level: Not on file  Social Needs  . Financial resource strain: Not on file  . Food  insecurity - worry: Not on file  . Food insecurity - inability: Not on file  . Transportation needs - medical: Not on file  . Transportation needs - non-medical: Not on file  Occupational History  . Not on file  Tobacco Use  . Smoking status: Never Smoker  . Smokeless tobacco: Never Used  Substance and Sexual Activity  . Alcohol use: No    Alcohol/week: 0.0 oz  . Drug use: No  . Sexual activity: Yes    Birth control/protection: None  Other Topics Concern  . Not on file  Social History Narrative  . Not on file    Family History  Problem Relation Age of Onset  . Heart disease Mother   . Stroke Father      Review of Systems  Constitutional: Negative for chills, fever, malaise/fatigue and weight loss.  HENT: Negative.  Negative for congestion, ear discharge, hearing loss, nosebleeds and sore throat.   Eyes: Negative.  Negative for blurred vision, double vision, discharge and redness.  Respiratory: Negative for cough, hemoptysis, shortness of breath and wheezing.   Cardiovascular: Positive for chest pain (Occasional short lived episodes unrelated to exertion lasting several seconds.). Negative for palpitations.  Gastrointestinal: Negative.  Negative for abdominal pain, blood in stool, diarrhea, nausea and vomiting.  Genitourinary: Negative for dysuria and hematuria.  Musculoskeletal: Positive for back pain. Negative for myalgias and neck pain.  Skin:  Negative for rash.  Neurological: Positive for weakness (Recent) and headaches (Intermittent chronic headaches). Negative for dizziness.  All other systems reviewed and are negative.   Vitals:   03/11/18 1520  BP: 129/79  Pulse: 81  Resp: 16  Temp: 98.7 F (37.1 C)  SpO2: 97%    Physical Exam  Constitutional: He is oriented to person, place, and time. He appears well-developed and well-nourished.  HENT:  Head: Normocephalic and atraumatic.  Right Ear: External ear normal.  Left Ear: External ear normal.  Nose: Nose  normal.  Mouth/Throat: Oropharynx is clear and moist.  Eyes: Conjunctivae and EOM are normal. Pupils are equal, round, and reactive to light.  Neck: Normal range of motion. Neck supple. No JVD present. No thyromegaly present.  Cardiovascular: Normal rate, regular rhythm, normal heart sounds and intact distal pulses.  Pulmonary/Chest: Effort normal and breath sounds normal.  Abdominal: Soft. Bowel sounds are normal. He exhibits no distension. There is no tenderness.  Musculoskeletal: Normal range of motion. He exhibits no edema or tenderness.  Lymphadenopathy:    He has no cervical adenopathy.  Neurological: He is alert and oriented to person, place, and time. He displays normal reflexes. No sensory deficit. He exhibits normal muscle tone. Coordination normal.  Skin: Skin is warm and dry. Capillary refill takes less than 2 seconds. No rash noted.  Psychiatric: He has a normal mood and affect. His behavior is normal.  Vitals reviewed.   EKG: Normal sinus rhythm with ventricular rate of 74/min.  No acute ischemic changes.  Normal intervals.  No old EKG to compare with.  ASSESSMENT & PLAN: Sean Watkins was seen today for annual exam and referral.  Diagnoses and all orders for this visit:  Routine general medical examination at a health care facility -     CBC with Differential -     Comprehensive metabolic panel -     Lipid panel -     Hemoglobin A1c -     HIV antibody -     EKG 12-Lead -     Ambulatory referral to Gastroenterology    Patient Instructions       IF you received an x-ray today, you will receive an invoice from [facility]. Please contact [facility] at [phone] with questions or concerns regarding your invoice.   IF you received labwork today, you will receive an invoice from [facility]. Please contact [facility] at [phone] with questions or concerns regarding your invoice.   Our billing staff will not be able to assist you with questions  regarding bills from these companies.  You will be contacted with the lab results as soon as they are available. The fastest way to get your results is to activate your My Chart account. Instructions are located on the last page of this paperwork. If you have not heard from [place] regarding the results in 2 weeks, please contact this office.      Health Maintenance, Male A healthy lifestyle and preventive care is important for your health and wellness. Ask your health care provider about what schedule of regular examinations is right for you. What should I know about weight and diet? Eat a Healthy Diet  Eat plenty of vegetables, fruits, whole grains, low-fat dairy products, and lean protein.  Do not eat a lot of foods high in solid fats, added sugars, or salt.  Maintain a Healthy Weight Regular exercise can help you achieve or maintain a healthy weight. You should:  Do at least 150 minutes of  exercise each week. The exercise should increase your heart rate and make you sweat (moderate-intensity exercise).  Do strength-training exercises at least twice a week.  Watch Your Levels of Cholesterol and Blood Lipids  Have your blood tested for lipids and cholesterol every 5 years starting at 51 years of age. If you are at high risk for heart disease, you should start having your blood tested when you are 51 years old. You may need to have your cholesterol levels checked more often if: ? Your lipid or cholesterol levels are high. ? You are older than 51 years of age. ? You are at high risk for heart disease.  What should I know about cancer screening? Many types of cancers can be detected early and may often be prevented. Lung Cancer  You should be screened every year for lung cancer if: ? You are a current smoker who has smoked for at least 30 years. ? You are a former smoker who has quit within the past 15 years.  Talk to your health care provider about your screening options, when you  should start screening, and how often you should be screened.  Colorectal Cancer  Routine colorectal cancer screening usually begins at 51 years of age and should be repeated every 5-10 years until you are 51 years old. You may need to be screened more often if early forms of precancerous polyps or small growths are found. Your health care provider may recommend screening at an earlier age if you have risk factors for colon cancer.  Your health care provider may recommend using home test kits to check for hidden blood in the stool.  A small camera at the end of a tube can be used to examine your colon (sigmoidoscopy or colonoscopy). This checks for the earliest forms of colorectal cancer.  Prostate and Testicular Cancer  Depending on your age and overall health, your health care provider may do certain tests to screen for prostate and testicular cancer.  Talk to your health care provider about any symptoms or concerns you have about testicular or prostate cancer.  Skin Cancer  Check your skin from head to toe regularly.  Tell your health care provider about any new moles or changes in moles, especially if: ? There is a change in a mole's size, shape, or color. ? You have a mole that is larger than a pencil eraser.  Always use sunscreen. Apply sunscreen liberally and repeat throughout the day.  Protect yourself by wearing long sleeves, pants, a wide-brimmed hat, and sunglasses when outside.  What should I know about heart disease, diabetes, and high blood pressure?  If you are 87-60 years of age, have your blood pressure checked every 3-5 years. If you are 84 years of age or older, have your blood pressure checked every year. You should have your blood pressure measured twice-once when you are at a hospital or clinic, and once when you are not at a hospital or clinic. Record the average of the two measurements. To check your blood pressure when you are not at a hospital or clinic, you  can use: ? An automated blood pressure machine at a pharmacy. ? A home blood pressure monitor.  Talk to your health care provider about your target blood pressure.  If you are between 64-55 years old, ask your health care provider if you should take aspirin to prevent heart disease.  Have regular diabetes screenings by checking your fasting blood sugar level. ? If you are  at a normal weight and have a low risk for diabetes, have this test once every three years after the age of 51. ? If you are overweight and have a high risk for diabetes, consider being tested at a younger age or more often.  A one-time screening for abdominal aortic aneurysm (AAA) by ultrasound is recommended for men aged 65-75 years who are current or former smokers. What should I know about preventing infection? Hepatitis B If you have a higher risk for hepatitis B, you should be screened for this virus. Talk with your health care provider to find out if you are at risk for hepatitis B infection. Hepatitis C Blood testing is recommended for:  Everyone born from [id number] through 1964.  Anyone with known risk factors for hepatitis C.  Sexually Transmitted Diseases (STDs)  You should be screened each year for STDs including gonorrhea and chlamydia if: ? You are sexually active and are younger than 51 years of age. ? You are older than 51 years of age and your health care provider tells you that you are at risk for this type of infection. ? Your sexual activity has changed since you were last screened and you are at an increased risk for chlamydia or gonorrhea. Ask your health care provider if you are at risk.  Talk with your health care provider about whether you are at high risk of being infected with HIV. Your health care provider may recommend a prescription medicine to help prevent HIV infection.  What else can I do?  Schedule regular health, dental, and eye exams.  Stay current with your vaccines  (immunizations).  Do not use any tobacco products, such as cigarettes, chewing tobacco, and e-cigarettes. If you need help quitting, ask your health care provider.  Limit alcohol intake to no more than 2 drinks per day. One drink equals 12 ounces of beer, 5 ounces of wine, or 1 ounces of hard liquor.  Do not use street drugs.  Do not share needles.  Ask your health care provider for help if you need support or information about quitting drugs.  Tell your health care provider if you often feel depressed.  Tell your health care provider if you have ever been abused or do not feel safe at home. This information is not intended to replace advice given to you by your health care provider. Make sure you discuss any questions you have with your health care provider. Document Released: 06/06/2008 Document Revised: 08/07/2016 Document Reviewed: 09/12/2015 Elsevier Interactive Patient Education  2018 [employer]  American Heart Association (AHA) Exercise Recommendation  Being physically active is important to prevent heart disease and stroke, the nation's No. 1and No. 5killers. To improve overall cardiovascular health, we suggest at least 150 minutes per week of moderate exercise or 75 minutes per week of vigorous exercise (or a combination of moderate and vigorous activity). Thirty minutes a day, five times a week is an easy goal to remember. You will also experience benefits even if you divide your time into two or three segments of 10 to 15 minutes per day.  For people who would benefit from lowering their blood pressure or cholesterol, we recommend 40 minutes of aerobic exercise of moderate to vigorous intensity three to four times a week to lower the risk for heart attack and stroke.  Physical activity is anything that makes you move your body and burn calories.  This includes things like climbing stairs or playing sports. Aerobic exercises benefit your heart,  and include walking, jogging,  swimming or biking. Strength and stretching exercises are best for overall stamina and flexibility.  The simplest, positive change you can make to effectively improve your heart health is to start walking. It's enjoyable, free, easy, social and great exercise. A walking program is flexible and boasts high success rates because people can stick with it. It's easy for walking to become a regular and satisfying part of life.   For Overall Cardiovascular Health:  At least 30 minutes of moderate-intensity aerobic activity at least 5 days per week for a total of 150  OR   At least 25 minutes of vigorous aerobic activity at least 3 days per week for a total of 75 minutes; or a combination of moderate- and vigorous-intensity aerobic activity  AND   Moderate- to high-intensity muscle-strengthening activity at least 2 days per week for additional health benefits.  For Lowering Blood Pressure and Cholesterol  An average 40 minutes of moderate- to vigorous-intensity aerobic activity 3 or 4 times per week  What if I can't make it to the time goal? Something is always better than nothing! And everyone has to start somewhere. Even if you've been sedentary for years, today is the day you can begin to make healthy changes in your life. If you don't think you'll make it for 30 or 40 minutes, set a reachable goal for today. You can work up toward your overall goal by increasing your time as you get stronger. Don't let all-or-nothing thinking rob you of doing what you can every day.  Source:[email or web address] [email or web address], MD [facility]

## 2018-03-12 LAB — CBC WITH DIFFERENTIAL/PLATELET
BASOS: 0 %
Basophils Absolute: 0 10*3/uL (ref 0.0–0.2)
EOS (ABSOLUTE): 0.2 10*3/uL (ref 0.0–0.4)
Eos: 2 %
Hematocrit: 45.3 % (ref 37.5–51.0)
Hemoglobin: 15.2 g/dL (ref 13.0–17.7)
Immature Grans (Abs): 0 10*3/uL (ref 0.0–0.1)
Immature Granulocytes: 0 %
Lymphocytes Absolute: 2.1 10*3/uL (ref 0.7–3.1)
Lymphs: 26 %
MCH: 29.5 pg (ref 26.6–33.0)
MCHC: 33.6 g/dL (ref 31.5–35.7)
MCV: 88 fL (ref 79–97)
MONOS ABS: 0.6 10*3/uL (ref 0.1–0.9)
Monocytes: 7 %
NEUTROS ABS: 5.3 10*3/uL (ref 1.4–7.0)
NEUTROS PCT: 65 %
Platelets: 245 10*3/uL (ref 150–379)
RBC: 5.16 x10E6/uL (ref 4.14–5.80)
RDW: 14.5 % (ref 12.3–15.4)
WBC: 8.2 10*3/uL (ref 3.4–10.8)

## 2018-03-12 LAB — LIPID PANEL
Chol/HDL Ratio: 5.3 ratio — ABNORMAL HIGH (ref 0.0–5.0)
Cholesterol, Total: 246 mg/dL — ABNORMAL HIGH (ref 100–199)
HDL: 46 mg/dL (ref >39)
LDL Calculated: 177 mg/dL — ABNORMAL HIGH (ref 0–99)
TRIGLYCERIDES: 113 mg/dL (ref 0–149)
VLDL Cholesterol Cal: 23 mg/dL (ref 5–40)

## 2018-03-12 LAB — HIV ANTIBODY (ROUTINE TESTING W REFLEX): HIV Screen 4th Generation wRfx: NONREACTIVE

## 2018-03-12 LAB — COMPREHENSIVE METABOLIC PANEL
A/G RATIO: 1.6 (ref 1.2–2.2)
ALK PHOS: 72 IU/L (ref 39–117)
ALT: 35 IU/L (ref 0–44)
AST: 30 IU/L (ref 0–40)
Albumin: 4.8 g/dL (ref 3.5–5.5)
BILIRUBIN TOTAL: 0.4 mg/dL (ref 0.0–1.2)
BUN/Creatinine Ratio: 14 (ref 9–20)
BUN: 11 mg/dL (ref 6–24)
CHLORIDE: 102 mmol/L (ref 96–106)
CO2: 24 mmol/L (ref 20–29)
Calcium: 9.9 mg/dL (ref 8.7–10.2)
Creatinine, Ser: 0.76 mg/dL (ref 0.76–1.27)
GFR calc Af Amer: 123 mL/min/{1.73_m2} (ref >59)
GFR calc non Af Amer: 106 mL/min/{1.73_m2} (ref >59)
Globulin, Total: 3 g/dL (ref 1.5–4.5)
Glucose: 98 mg/dL (ref 65–99)
Potassium: 4.7 mmol/L (ref 3.5–5.2)
Sodium: 141 mmol/L (ref 134–144)
Total Protein: 7.8 g/dL (ref 6.0–8.5)

## 2018-03-12 LAB — HEMOGLOBIN A1C
Est. average glucose Bld gHb Est-mCnc: 123 mg/dL
Hgb A1c MFr Bld: 5.9 % — ABNORMAL HIGH (ref 4.8–5.6)

## 2018-03-16 ENCOUNTER — Encounter: Payer: Self-pay | Admitting: *Deleted

## 2018-04-20 ENCOUNTER — Encounter: Payer: Self-pay | Admitting: Emergency Medicine

## 2018-09-20 ENCOUNTER — Encounter (HOSPITAL_COMMUNITY): Payer: Self-pay | Admitting: *Deleted

## 2018-09-20 ENCOUNTER — Ambulatory Visit (HOSPITAL_COMMUNITY)
Admission: EM | Admit: 2018-09-20 | Discharge: 2018-09-20 | Disposition: A | Discharge status: Home / Self Care | Payer: BLUE CROSS/BLUE SHIELD | Attending: Family Medicine | Admitting: Family Medicine

## 2018-09-20 DIAGNOSIS — M109 Gout, unspecified: Secondary | ICD-10-CM

## 2018-09-20 DIAGNOSIS — M10071 Idiopathic gout, right ankle and foot: Secondary | ICD-10-CM | POA: Diagnosis not present

## 2018-09-20 HISTORY — DX: Gout, unspecified: M10.9

## 2018-09-20 MED ORDER — METHYLPREDNISOLONE SODIUM SUCC 125 MG IJ SOLR
80.0000 mg | Freq: Once | INTRAMUSCULAR | Status: AC
  Administered 2018-09-20: 80 mg via INTRAMUSCULAR

## 2018-09-20 MED ORDER — PREDNISONE 20 MG PO TABS: ORAL_TABLET | ORAL | 0 refills | Status: DC

## 2018-09-20 MED ORDER — METHYLPREDNISOLONE SODIUM SUCC 125 MG IJ SOLR
INTRAMUSCULAR | Status: AC
  Filled 2018-09-20: qty 2

## 2018-09-20 NOTE — ED Provider Notes (Signed)
[facility]    CSN: [id number] Arrival date & time: 09/20/18  1038     History   Chief Complaint Chief Complaint  Patient presents with  . Foot Pain    HPI Bensyn Bornemann is a 52 y.o. male.   Acute Gout Attack  Gout: Patient here for evaluation of acute gout. The patient reports onset of an acute gout attack involving the right first MTP joint beginning 4 days, and being treated with none. Attacks occur primarily in the right first MTP joint. Patient reports his pain is ongoing, his joint stiffness is ongoing and his joint swelling is ongoing. Limitation on activities include difficulty with walking. The patient is avoiding high purine foods and reports consuming little to no alcohol. Past Medical History:  Diagnosis Date  . Allergy   . Arthritis   . Glaucoma   . Gout     Patient Active Problem List   Diagnosis Date Noted  . Gout attack 07/16/2013    History reviewed. No pertinent surgical history.     Home Medications    Prior to Admission medications   Not on File    Family History Family History  Problem Relation Age of Onset  . Heart disease Mother   . Diabetes Mother   . Stroke Father     Social History Social History   Tobacco Use  . Smoking status: Never Smoker  . Smokeless tobacco: Never Used  Substance Use Topics  . Alcohol use: No  . Drug use: No     Allergies   Other and Indocin [indomethacin]   Review of Systems Review of Systems Pertinent negatives listed in HPI  Physical Exam Triage Vital Signs ED Triage Vitals  Enc Vitals Group     BP 09/20/18 1130 (!) 149/84     Pulse Rate 09/20/18 1129 (!) 55     Resp 09/20/18 1129 16     Temp 09/20/18 1129 98.3 F (36.8 C)     Temp Source 09/20/18 1129 Oral     SpO2 09/20/18 1129 97 %     Weight --      Height --      Head Circumference --      Peak Flow --      Pain Score 09/20/18 1130 8     Pain Loc --      Pain Edu? --      Excl. in GC? --    No data  found.  Updated Vital Signs BP (!) 149/84   Pulse (!) 55   Temp 98.3 F (36.8 C) (Oral)   Resp 16   SpO2 97%   Visual Acuity Right Eye Distance:   Left Eye Distance:   Bilateral Distance:    Right Eye Near:   Left Eye Near:    Bilateral Near:     Physical Exam  Musculoskeletal:       Feet:     UC Treatments / Results  Labs (all labs ordered are listed, but only abnormal results are displayed) Labs Reviewed - No data to display  EKG None  Radiology No results found.  Procedures Procedures (including critical care time)  Medications Ordered in UC Medications - No data to display  Initial Impression / Assessment and Plan / UC Course  I have reviewed the triage vital signs and the nursing notes.  Pertinent labs & imaging results that were available during my care of the patient were reviewed by me and considered in my medical  decision making (see chart for details).  Patient presents today with an acute gout laceration.  He has a history of gout and has had occurrences in the past.  He has had an allergic reaction to indomethacin in the past.  No recent uric acid level to evaluate.  Will treat him in office today with 1 dose of Solu-Medrol 80 mg and will start him on extended prednisone taper starting tomorrow.  Patient advised to follow-up with primary care for evaluation of uric acid level along with evaluation of slightly elevated blood pressure.  Patient understands plans and agrees.    Final Clinical Impressions(s) / UC Diagnoses   Final diagnoses:  Acute gout involving toe of right foot, unspecified cause     Discharge Instructions     Please follow-up with your primary care regarding management of chronic gout and elevation of blood pressure.  You are being treated with Prednisone taper for current gout outbreak.  Take medication as follows starting tomorrow: Take Prednisone 20 mg,  in mornings with breakfast as follows:  Take 3 pills for 3 days,  Take 2 pills for 3 days, and Take 1 pill for 3 days.  Complete all medication.  For pain you may take acetaminophen (tylenol) 650 mg every 6-8 hours as needed for pain.      ED Prescriptions    Medication Sig Dispense Auth. Provider   predniSONE (DELTASONE) 20 MG tablet Take 3 PO QAM x3days, 2 PO QAM x3days, 1 PO QAM x3days 18 tablet Bing Neighbors, FNP     Controlled Substance Prescriptions [place] Controlled Substance Registry consulted? Not Applicable   Bing Neighbors, FNP 09/20/18 1237

## 2018-09-20 NOTE — Discharge Instructions (Addendum)
Please follow-up with your primary care regarding management of chronic gout and elevation of blood pressure.  You are being treated with Prednisone taper for current gout outbreak.  Take medication as follows starting tomorrow: Take Prednisone 20 mg,  in mornings with breakfast as follows:  Take 3 pills for 3 days, Take 2 pills for 3 days, and Take 1 pill for 3 days.  Complete all medication.  For pain you may take acetaminophen (tylenol) 650 mg every 6-8 hours as needed for pain.

## 2018-09-20 NOTE — ED Triage Notes (Signed)
Pt reports right foot pain x 4 days.   Ambulating with limp.  Denies injury.  Pt feels it may be gout and would like a blood test.

## 2018-10-08 ENCOUNTER — Encounter: Payer: Self-pay | Admitting: Physician Assistant

## 2018-10-08 ENCOUNTER — Ambulatory Visit: Payer: BLUE CROSS/BLUE SHIELD | Admitting: Physician Assistant

## 2018-10-08 ENCOUNTER — Other Ambulatory Visit: Payer: Self-pay

## 2018-10-08 VITALS — BP 138/85 | HR 69 | Temp 99.3°F | Resp 18 | Ht 66.0 in | Wt 175.4 lb

## 2018-10-08 DIAGNOSIS — M109 Gout, unspecified: Secondary | ICD-10-CM | POA: Diagnosis not present

## 2018-10-08 MED ORDER — PREDNISONE 10 MG PO TABS: ORAL_TABLET | ORAL | 0 refills | Status: DC

## 2018-10-08 NOTE — Progress Notes (Signed)
Sean Watkins  MRN: [id number] DOB: July 11, 1966  Subjective:  Sean Watkins is a 52 y.o. male seen in office today for a chief complaint of worsening right foot pain x 7 days.  Seen at an urgent care on 09/20/18 for gout flare.  Treated with 9-day course of prednisone.  Symptoms completely resolved.  2 days after stopping prednisone course notes the flare came back with revenge.  Associated pain, swelling, and heat.Limitation on activities include difficulty with walking. The patient is avoiding high purine foods and reports consuming little to no alcohol.  He does admit to drinking lots of cranberry juice recently.In the past 5 years, has had 3 flares.  Does not want to be on a daily medication. Denies fever, chills, N/V/D. No PMH of diabetes.   Review of Systems  Per HPI  Patient Active Problem List   Diagnosis Date Noted  . Gout attack 07/16/2013    No current outpatient medications on file prior to visit.   No current facility-administered medications on file prior to visit.     Allergies  Allergen Reactions  . 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 Indocin [Indomethacin] Rash    POSSIBLY due to indocin     Objective:  BP 138/85   Pulse 69   Temp 99.3 F (37.4 C) (Oral)   Resp 18   Ht 5\' 6"  (1.676 m)   Wt 175 lb 6.4 oz (79.6 kg)   SpO2 98%   BMI 28.31 kg/m   Physical Exam  Constitutional: He is oriented to person, place, and time. He appears well-developed and well-nourished.  HENT:  Head: Normocephalic and atraumatic.  Eyes: Conjunctivae are normal.  Neck: Normal range of motion.  Cardiovascular:  Pulses:      Dorsalis pedis pulses are 2+ on the right side, and 2+ on the left side.       Posterior tibial pulses are 2+ on the right side, and 2+ on the left side.  Pulmonary/Chest: Effort normal.  Musculoskeletal:       Right foot: There is decreased range of motion (ROM limited due to pain), tenderness and swelling. There  is normal capillary refill, no crepitus and no laceration.       Feet:  Neurological: He is alert and oriented to person, place, and time.  Skin: Skin is warm and dry.  Psychiatric: He has a normal mood and affect.  Vitals reviewed.   Assessment and Plan :  1. Acute gout involving toe of right foot, unspecified cause History and physical exam findings consistent with rebound gout flare.  Vitals stable.  He is afebrile.  No concern for infectious etiology at this time.  Recommend extended course of prednisone with slow taper.  Discussed low purine diet.  Advised to return to clinic if symptoms worsen, do not improve, or as needed.  Meds ordered this encounter  Medications  . predniSONE (DELTASONE) 10 MG tablet    Sig: 6-6-5-5-4-4-3-3-2-2-2-1-1-1-0.5-0.5 taper. Take all pills for that day in the am with food.    Dispense:  46 tablet    Refill:  0    Order Specific Question:   Supervising Provider    Answer:   Georgina Quint [[id number]]   Side effects, risks, benefits, and alternatives of the medications and treatment plan prescribed today were discussed, and patient expressed understanding of the instructions given. No barriers to understanding were identified. Red flags discussed in detail. Pt  expressed understanding regarding what to do in case of emergency/urgent symptoms.  Benjiman Core PA-C  [facility] 10/08/2018 5:22 PM

## 2018-10-08 NOTE — Patient Instructions (Addendum)
Rebound flares are relatively common once glucocorticoids are withdrawn, especially in patients who have previously suffered a number of prior flares, whose intercritical periods have progressively shortened, and who are not receiving antiinflammatory flare prophylaxis; in these groups of patients, slower tapering of the glucocorticoid dose with extension of the duration of the taper to 14-20 days is recommended.We are going to treat your underlying inflammation with oral prednisone. Prednisone is a steroid and can cause side effects such as headache, irritability, nausea, vomiting, increased heart rate, increased blood pressure, increased blood sugar, appetite changes, and insomnia. Please take tablets in the morning with a full meal to help decrease the chances of these side effects.     If you have lab work done today you will be contacted with your lab results within the next 2 weeks.  If you have not heard from [place] then please contact us. The fastest way to get your results is to register for My Chart.    Low-Purine Diet Purines are compounds that affect the level of uric acid in your body. A low-purine diet is a diet that is low in purines. Eating a low-purine diet can prevent the level of uric acid in your body from getting too high and causing gout or kidney stones or both. What do I need to know about this diet?  Choose low-purine foods. Examples of low-purine foods are listed in the next section.  Drink plenty of fluids, especially water. Fluids can help remove uric acid from your body. Try to drink 8-16 cups (1.9-3.8 L) a day.  Limit foods high in fat, especially saturated fat, as fat makes it harder for the body to get rid of uric acid. Foods high in saturated fat include pizza, cheese, ice cream, whole milk, fried foods, and gravies. Choose foods that are lower in fat and lean sources of protein. Use olive oil when cooking as it contains healthy fats that are not high in saturated  fat.  Limit alcohol. Alcohol interferes with the elimination of uric acid from your body. If you are having a gout attack, avoid all alcohol.  Keep in mind that different people's bodies react differently to different foods. You will probably learn over time which foods do or do not affect you. If you discover that a food tends to cause your gout to flare up, avoid eating that food. You can more freely enjoy foods that do not cause problems. If you have any questions about a food item, talk to your dietitian or health care provider. Which foods are low, moderate, and high in purines? The following is a list of foods that are low, moderate, and high in purines. You can eat any amount of the foods that are low in purines. You may be able to have small amounts of foods that are moderate in purines. Ask your health care provider how much of a food moderate in purines you can have. Avoid foods high in purines. Grains  Foods low in purines: Enriched white bread, pasta, rice, cake, cornbread, popcorn.  Foods moderate in purines: Whole-grain breads and cereals, wheat germ, bran, oatmeal. Uncooked oatmeal. Dry wheat bran or wheat germ.  Foods high in purines: Pancakes, [place] toast, biscuits, muffins. Vegetables  Foods low in purines: All vegetables, except those that are moderate in purines.  Foods moderate in purines: Asparagus, cauliflower, spinach, mushrooms, green peas. Fruits  All fruits are low in purines. Meats and other Protein Foods  Foods low in purines: Eggs, nuts, peanut  butter.  Foods moderate in purines: 80-90% lean beef, lamb, veal, pork, poultry, fish, eggs, peanut butter, nuts. Crab, lobster, oysters, and shrimp. Cooked dried beans, peas, and lentils.  Foods high in purines: Anchovies, sardines, herring, mussels, tuna, codfish, scallops, trout, and haddock. Tomasa Blase. Organ meats (such as liver or kidney). Tripe. Game meat. Goose. Sweetbreads. Dairy  All dairy foods are low in  purines. Low-fat and fat-free dairy products are best because they are low in saturated fat. Beverages  Drinks low in purines: Water, carbonated beverages, tea, coffee, cocoa.  Drinks moderate in purines: Soft drinks and other drinks sweetened with high-fructose corn syrup. Juices. To find whether a food or drink is sweetened with high-fructose corn syrup, look at the ingredients list.  Drinks high in purines: Alcoholic beverages (such as beer). Condiments  Foods low in purines: Salt, herbs, olives, pickles, relishes, vinegar.  Foods moderate in purines: Butter, margarine, oils, mayonnaise. Fats and Oils  Foods low in purines: All types, except gravies and sauces made with meat.  Foods high in purines: Gravies and sauces made with meat. Other Foods  Foods low in purines: Sugars, sweets, gelatin. Cake. Soups made without meat.  Foods moderate in purines: Meat-based or fish-based soups, broths, or bouillons. Foods and drinks sweetened with high-fructose corn syrup.  Foods high in purines: High-fat desserts (such as ice cream, cookies, cakes, pies, doughnuts, and chocolate). Contact your dietitian for more information on foods that are not listed here. This information is not intended to replace advice given to you by your health care provider. Make sure you discuss any questions you have with your health care provider. Document Released: 04/05/2011 Document Revised: 05/16/2016 Document Reviewed: 11/15/2013 Elsevier Interactive Patient Education  2017 [employer].  IF you received an x-ray today, you will receive an invoice from [facility]. Please contact [facility] at [phone] with questions or concerns regarding your invoice.   IF you received labwork today, you will receive an invoice from [place]. Please contact [facility] at [phone] with questions or concerns regarding your invoice.   Our billing staff will not be able to assist you with questions  regarding bills from these companies.  You will be contacted with the lab results as soon as they are available. The fastest way to get your results is to activate your My Chart account. Instructions are located on the last page of this paperwork. If you have not heard from [place] regarding the results in 2 weeks, please contact this office.

## 2018-11-14 ENCOUNTER — Other Ambulatory Visit: Payer: Self-pay

## 2018-11-14 ENCOUNTER — Ambulatory Visit: Payer: BLUE CROSS/BLUE SHIELD | Admitting: Family Medicine

## 2018-11-14 ENCOUNTER — Encounter: Payer: Self-pay | Admitting: Family Medicine

## 2018-11-14 VITALS — BP 127/76 | HR 61 | Temp 98.2°F | Resp 18 | Ht 66.54 in | Wt 175.2 lb

## 2018-11-14 DIAGNOSIS — R102 Pelvic and perineal pain: Secondary | ICD-10-CM

## 2018-11-14 DIAGNOSIS — R1031 Right lower quadrant pain: Secondary | ICD-10-CM | POA: Diagnosis not present

## 2018-11-14 DIAGNOSIS — R350 Frequency of micturition: Secondary | ICD-10-CM | POA: Diagnosis not present

## 2018-11-14 DIAGNOSIS — R109 Unspecified abdominal pain: Secondary | ICD-10-CM | POA: Diagnosis not present

## 2018-11-14 DIAGNOSIS — R682 Dry mouth, unspecified: Secondary | ICD-10-CM

## 2018-11-14 LAB — POCT CBC
Granulocyte percent: 54.8 %G (ref 37–80)
HEMATOCRIT: 44.2 % — AB (ref 29–41)
Hemoglobin: 15.1 g/dL — AB (ref 9.5–13.5)
Lymph, poc: 2.4 (ref 0.6–3.4)
MCH, POC: 30 pg (ref 27–31.2)
MCHC: 34.3 g/dL (ref 31.8–35.4)
MCV: 87.7 fL (ref 76–111)
MID (cbc): 0.4 (ref 0–0.9)
MPV: 8.6 fL (ref 0–99.8)
POC GRANULOCYTE: 3.4 (ref 2–6.9)
POC LYMPH %: 38.8 % (ref 10–50)
POC MID %: 6.4 % (ref 0–12)
Platelet Count, POC: 238 10*3/uL (ref 142–424)
RBC: 5.04 M/uL (ref 4.69–6.13)
RDW, POC: 13.8 %
WBC: 6.2 10*3/uL (ref 4.6–10.2)

## 2018-11-14 LAB — POCT URINALYSIS DIP (MANUAL ENTRY)
BILIRUBIN UA: NEGATIVE mg/dL
Bilirubin, UA: NEGATIVE
Blood, UA: NEGATIVE
Glucose, UA: NEGATIVE mg/dL
LEUKOCYTES UA: NEGATIVE
Nitrite, UA: NEGATIVE
Protein Ur, POC: NEGATIVE mg/dL
SPEC GRAV UA: 1.01 (ref 1.010–1.025)
Urobilinogen, UA: 0.2 E.U./dL
pH, UA: 5.5 (ref 5.0–8.0)

## 2018-11-14 LAB — POC MICROSCOPIC URINALYSIS (UMFC): MUCUS RE: ABSENT

## 2018-11-14 LAB — GLUCOSE, POCT (MANUAL RESULT ENTRY): POC GLUCOSE: 95 mg/dL (ref 70–99)

## 2018-11-14 MED ORDER — CIPROFLOXACIN HCL 500 MG PO TABS: 500.0000 mg | ORAL_TABLET | Freq: Two times a day (BID) | ORAL | 0 refills | Status: DC

## 2018-11-14 NOTE — Patient Instructions (Addendum)
Blood count and urine test overall looked okay in the office.  Symptoms could be related to prostate infection. I will check prostate test with your blood work as well as kidney function test.  Can try starting antibiotic twice per day for now and recheck in the next 1 week if not improving, sooner if worse.  If any increasing pain or blood in the urine return sooner.    Urinary Frequency, Adult Urinary frequency means urinating more often than usual. People with urinary frequency urinate at least 8 times in 24 hours, even if they drink a normal amount of fluid. Although they urinate more often than normal, the total amount of urine produced in a day may be normal. Urinary frequency is also called pollakiuria. What are the causes? This condition may be caused by:  A urinary tract infection.  Obesity.  Bladder problems, such as bladder stones.  Caffeine or alcohol.  Eating food or drinking fluids that irritate the bladder. These include coffee, tea, soda, artificial sweeteners, citrus, tomato-based foods, and chocolate.  Certain medicines, such as medicines that help the body get rid of extra fluid (diuretics).  Muscle or nerve weakness.  Overactive bladder.  Chronic diabetes.  Interstitial cystitis.  In men, problems with the prostate, such as an enlarged prostate.  In women, pregnancy.  In some cases, the cause may not be known. What increases the risk? This condition is more likely to develop in:  Women who have gone through menopause.  Men with prostate problems.  People with a disease or injury that affects the nerves or spinal cord.  People who have or have had a condition that affects the brain, such as a stroke.  What are the signs or symptoms? Symptoms of this condition include:  Feeling an urgent need to urinate often. The stress and anxiety of needing to find a bathroom quickly can make this urge worse.  Urinating 8 or more times in 24 hours.  Urinating as  often as every 1 to 2 hours.  How is this diagnosed? This condition is diagnosed based on your symptoms, your medical history, and a physical exam. You may have tests, such as:  Blood tests.  Urine tests.  Imaging tests, such as X-rays or ultrasounds.  A bladder test.  A test of your neurological system. This is the body system that senses the need to urinate.  A test to check for problems in the urethra and bladder called cystoscopy.  You may also be asked to keep a bladder diary. A bladder diary is a record of what you eat and drink, how often you urinate, and how much you urinate. You may need to see a health care provider who specializes in conditions of the urinary tract (urologist) or kidneys (nephrologist). How is this treated? Treatment for this condition depends on the cause. Sometimes the condition goes away on its own and treatment is not necessary. If treatment is needed, it may include:  Taking medicine.  Learning exercises that strengthen the muscles that help control urination.  Following a bladder training program. This may include: ? Learning to delay going to the bathroom. ? Double urinating (voiding). This helps if you are not completely emptying your bladder. ? Scheduled voiding.  Making diet changes, such as: ? Avoiding caffeine. ? Drinking fewer fluids, especially alcohol. ? Not drinking in the evening. ? Not having foods or drinks that may irritate the bladder. ? Eating foods that help prevent or ease constipation. Constipation can make this  condition worse.  Having the nerves in your bladder stimulated. There are two options for stimulating the nerves to your bladder: ? Outpatient electrical nerve stimulation. This is done by your health care provider. ? Surgery to implant a bladder pacemaker. The pacemaker helps to control the urge to urinate.  Follow these instructions at home:  Keep a bladder diary if told to by your health care provider.  Take  over-the-counter and prescription medicines only as told by your health care provider.  Do any exercises as told by your health care provider.  Follow a bladder training program as told by your health care provider.  Make any recommended diet changes.  Keep all follow-up visits as told by your health care provider. This is important. Contact a health care provider if:  You start urinating more often.  You feel pain or irritation when you urinate.  You notice blood in your urine.  Your urine looks cloudy.  You develop a fever.  You begin vomiting. Get help right away if:  You are unable to urinate. This information is not intended to replace advice given to you by your health care provider. Make sure you discuss any questions you have with your health care provider. Document Released: 10/05/2009 Document Revised: 01/10/2016 Document Reviewed: 07/05/2015 Elsevier Interactive Patient Education  [employer].   If you have lab work done today you will be contacted with your lab results within the next 2 weeks.  If you have not heard from [place] then please contact us. The fastest way to get your results is to register for My Chart.   IF you received an x-ray today, you will receive an invoice from [facility]. Please contact [facility] at [phone] with questions or concerns regarding your invoice.   IF you received labwork today, you will receive an invoice from [place]. Please contact [facility] at [phone] with questions or concerns regarding your invoice.   Our billing staff will not be able to assist you with questions regarding bills from these companies.  You will be contacted with the lab results as soon as they are available. The fastest way to get your results is to activate your My Chart account. Instructions are located on the last page of this paperwork. If you have not heard from [place] regarding the results in 2 weeks, please contact this  office.

## 2018-11-14 NOTE — Progress Notes (Signed)
Subjective:  By signing my name below, I, Sean Watkins, attest that this documentation has been prepared under the direction and in the presence of [name], MD. Electronically Signed: Stann Watkins, Scribe. 11/14/2018 , 12:40 PM .  Patient was seen in Room 12 .   Patient ID: Sean Watkins, male    DOB: Sep 01, 1966, 52 y.o.   MRN: [id number] Chief Complaint  Patient presents with  . Urinary Frequency    X 1.5 mth  . Abdominal Cramping    X 3-4 week on right lower side- pt states it started as lower back pain   HPI Sean Watkins is a 52 y.o. male  Patient states he's been having increased urinary frequency throughout the day and night, about every hour; present for the past week. He also informs having occasional dry mouth as well.   He also complains having right low back pain that radiates over his right flank and into this right sided abdomen that's been intermittent for the past 2 months; however, the cramping has been more constant in the past week. He denies rash, hematuria, testicular pain or blood in stools. He has noticed a correlation of darker urine when he doesn't drink enough water; after he drinks more water, his abdominal pain improves. He denies any penile discharge. He denies any extramarital sexual activity. He denies history of STI. He's been married for 14 years. He denies known history of prostate issues, prostatitis or kidney stones.   Patient Active Problem List   Diagnosis Date Noted  . Gout attack 07/16/2013   Past Medical History:  Diagnosis Date  . Allergy   . Arthritis   . Glaucoma   . Gout    History reviewed. No pertinent surgical history. Allergies  Allergen Reactions  . 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 Indocin [Indomethacin] Rash    POSSIBLY due to indocin   Prior to Admission medications   Medication Sig Start Date End Date Taking? Authorizing Provider  predniSONE (DELTASONE) 10 MG tablet  6-6-5-5-4-4-3-3-2-2-2-1-1-1-0.5-0.5 taper. Take all pills for that day in the am with food. 10/08/18   [name], PA-C   Social History   Socioeconomic History  . Marital status: Married    Spouse name: Not on file  . Number of children: 2  . Years of education: Not on file  . Highest education level: Not on file  Occupational History  . Not on file  Social Needs  . Financial resource strain: Not on file  . Food insecurity:    Worry: Not on file    Inability: Not on file  . Transportation needs:    Medical: Not on file    Non-medical: Not on file  Tobacco Use  . Smoking status: Never Smoker  . Smokeless tobacco: Never Used  Substance and Sexual Activity  . Alcohol use: No    Frequency: Never  . Drug use: No  . Sexual activity: Yes  Lifestyle  . Physical activity:    Days per week: Not on file    Minutes per session: Not on file  . Stress: Not on file  Relationships  . Social connections:    Talks on phone: Not on file    Gets together: Not on file    Attends religious service: Not on file    Active member of club or organization: Not on file    Attends meetings of clubs or organizations: Not  on file    Relationship status: Not on file  . Intimate partner violence:    Fear of current or ex partner: Not on file    Emotionally abused: Not on file    Physically abused: Not on file    Forced sexual activity: Not on file  Other Topics Concern  . Not on file  Social History Narrative  . Not on file   Review of Systems  Constitutional: Negative for fatigue and unexpected weight change.  Eyes: Negative for visual disturbance.  Respiratory: Negative for cough, chest tightness and shortness of breath.   Cardiovascular: Negative for chest pain, palpitations and leg swelling.  Gastrointestinal: Positive for abdominal pain. Negative for blood in stool, diarrhea, nausea and vomiting.  Genitourinary: Positive for flank pain and frequency. Negative for discharge,  dysuria, hematuria and testicular pain.  Musculoskeletal: Positive for back pain.  Neurological: Negative for dizziness, light-headedness and headaches.       Objective:   Physical Exam  Constitutional: He is oriented to person, place, and time. He appears well-developed and well-nourished. No distress.  HENT:  Head: Normocephalic and atraumatic.  Eyes: Pupils are equal, round, and reactive to light. EOM are normal.  Neck: Neck supple.  Cardiovascular: Normal rate.  Pulmonary/Chest: Effort normal. No respiratory distress.  Abdominal: Bowel sounds are normal. There is tenderness in the suprapubic area. There is no CVA tenderness.  Abdomen flat, slight right sided suprapubic tenderness  Genitourinary:  Genitourinary Comments: No penile discharge or rash, testicles non tender, no hernia DRE: no focal prostate tenderness, borderline enlarged  Musculoskeletal: Normal range of motion.  Neurological: He is alert and oriented to person, place, and time.  Skin: Skin is warm and dry.  Psychiatric: He has a normal mood and affect. His behavior is normal.  Nursing note and vitals reviewed.   Vitals:   11/14/18 1152  BP: 127/76  Pulse: 61  Resp: 18  Temp: 98.2 F (36.8 C)  TempSrc: Oral  SpO2: 99%  Weight: 175 lb 3.2 oz (79.5 kg)  Height: 5' 6.54" (1.69 m)   Results for orders placed or performed in visit on 11/14/18  POCT urinalysis dipstick  Result Value Ref Range   Color, UA yellow yellow   Clarity, UA clear clear   Glucose, UA negative negative mg/dL   Bilirubin, UA negative negative   Ketones, POC UA negative negative mg/dL   Spec Grav, UA [id number] [id number] - 1.025   Blood, UA negative negative   pH, UA 5.5 5.0 - 8.0   Protein Ur, POC negative negative mg/dL   Urobilinogen, UA 0.2 0.2 or 1.0 E.U./dL   Nitrite, UA Negative Negative   Leukocytes, UA Negative Negative  POCT Microscopic Urinalysis ([facility])  Result Value Ref Range   WBC,UR,HPF,POC None None WBC/hpf    RBC,UR,HPF,POC None None RBC/hpf   Bacteria None None, Too numerous to count   Mucus Absent Absent   Epithelial Cells, UR Per Microscopy None None, Too numerous to count cells/hpf  POCT glucose (manual entry)  Result Value Ref Range   POC Glucose 95 70 - 99 mg/dl  POCT CBC  Result Value Ref Range   WBC 6.2 4.6 - 10.2 K/uL   Lymph, poc 2.4 0.6 - 3.4   POC LYMPH PERCENT 38.8 10 - 50 %L   MID (cbc) 0.4 0 - 0.9   POC MID % 6.4 0 - 12 %M   POC Granulocyte 3.4 2 - 6.9   Granulocyte percent 54.8 37 - 80 %G  RBC 5.04 4.69 - 6.13 M/uL   Hemoglobin 15.1 (A) 9.5 - 13.5 g/dL   HCT, POC 16.1 (A) 29 - 41 %   MCV 87.7 76 - 111 fL   MCH, POC 30.0 27 - 31.2 pg   MCHC 34.3 31.8 - 35.4 g/dL   RDW, POC 09.6 %   Platelet Count, POC 238 142 - 424 K/uL   MPV 8.6 0 - 99.8 fL       Assessment & Plan:    Carlon Chaloux is a 52 y.o. male Urine frequency - Plan: POCT urinalysis dipstick, POCT Microscopic Urinalysis ([facility]), GC/Chlamydia Probe Amp, PSA, POCT glucose (manual entry), Basic metabolic panel, Urine Culture, ciprofloxacin (CIPRO) 500 MG tablet  RLQ abdominal pain - Plan: PSA, POCT CBC, Urine Culture, ciprofloxacin (CIPRO) 500 MG tablet  Flank pain  Dry mouth - Plan: POCT glucose (manual entry), Basic metabolic panel  Suprapubic abdominal pain - Plan: ciprofloxacin (CIPRO) 500 MG tablet  Intermittent right flank pain, with right lower quadrant/suprapubic pain, urinary urgency.  Reassuring urinalysis, glucose, CBC.  Differential still includes prostatitis although not significantly tender on exam.  -Check PSA, start Cipro for now.  Potential risks and side effects including tendon risks discussed, understanding expressed.  -Check a BMP, although diabetes insipidus less likely.  -Unlikely nephrolith as no blood on urinalysis currently, but consider CT renal stone study if persistent symptoms  -RTC/ER precautions if worse.  Meds ordered this encounter  Medications  . ciprofloxacin (CIPRO)  500 MG tablet    Sig: Take 1 tablet (500 mg total) by mouth 2 (two) times daily.    Dispense:  20 tablet    Refill:  0   Patient Instructions   Blood count and urine test overall looked okay in the office.  Symptoms could be related to prostate infection. I will check prostate test with your blood work as well as kidney function test.  Can try starting antibiotic twice per day for now and recheck in the next 1 week if not improving, sooner if worse.  If any increasing pain or blood in the urine return sooner.    Urinary Frequency, Adult Urinary frequency means urinating more often than usual. People with urinary frequency urinate at least 8 times in 24 hours, even if they drink a normal amount of fluid. Although they urinate more often than normal, the total amount of urine produced in a day may be normal. Urinary frequency is also called pollakiuria. What are the causes? This condition may be caused by:  A urinary tract infection.  Obesity.  Bladder problems, such as bladder stones.  Caffeine or alcohol.  Eating food or drinking fluids that irritate the bladder. These include coffee, tea, soda, artificial sweeteners, citrus, tomato-based foods, and chocolate.  Certain medicines, such as medicines that help the body get rid of extra fluid (diuretics).  Muscle or nerve weakness.  Overactive bladder.  Chronic diabetes.  Interstitial cystitis.  In men, problems with the prostate, such as an enlarged prostate.  In women, pregnancy.  In some cases, the cause may not be known. What increases the risk? This condition is more likely to develop in:  Women who have gone through menopause.  Men with prostate problems.  People with a disease or injury that affects the nerves or spinal cord.  People who have or have had a condition that affects the brain, such as a stroke.  What are the signs or symptoms? Symptoms of this condition include:  Feeling an urgent need  to urinate  often. The stress and anxiety of needing to find a bathroom quickly can make this urge worse.  Urinating 8 or more times in 24 hours.  Urinating as often as every 1 to 2 hours.  How is this diagnosed? This condition is diagnosed based on your symptoms, your medical history, and a physical exam. You may have tests, such as:  Blood tests.  Urine tests.  Imaging tests, such as X-rays or ultrasounds.  A bladder test.  A test of your neurological system. This is the body system that senses the need to urinate.  A test to check for problems in the urethra and bladder called cystoscopy.  You may also be asked to keep a bladder diary. A bladder diary is a record of what you eat and drink, how often you urinate, and how much you urinate. You may need to see a health care provider who specializes in conditions of the urinary tract (urologist) or kidneys (nephrologist). How is this treated? Treatment for this condition depends on the cause. Sometimes the condition goes away on its own and treatment is not necessary. If treatment is needed, it may include:  Taking medicine.  Learning exercises that strengthen the muscles that help control urination.  Following a bladder training program. This may include: ? Learning to delay going to the bathroom. ? Double urinating (voiding). This helps if you are not completely emptying your bladder. ? Scheduled voiding.  Making diet changes, such as: ? Avoiding caffeine. ? Drinking fewer fluids, especially alcohol. ? Not drinking in the evening. ? Not having foods or drinks that may irritate the bladder. ? Eating foods that help prevent or ease constipation. Constipation can make this condition worse.  Having the nerves in your bladder stimulated. There are two options for stimulating the nerves to your bladder: ? Outpatient electrical nerve stimulation. This is done by your health care provider. ? Surgery to implant a bladder pacemaker. The  pacemaker helps to control the urge to urinate.  Follow these instructions at home:  Keep a bladder diary if told to by your health care provider.  Take over-the-counter and prescription medicines only as told by your health care provider.  Do any exercises as told by your health care provider.  Follow a bladder training program as told by your health care provider.  Make any recommended diet changes.  Keep all follow-up visits as told by your health care provider. This is important. Contact a health care provider if:  You start urinating more often.  You feel pain or irritation when you urinate.  You notice blood in your urine.  Your urine looks cloudy.  You develop a fever.  You begin vomiting. Get help right away if:  You are unable to urinate. This information is not intended to replace advice given to you by your health care provider. Make sure you discuss any questions you have with your health care provider. Document Released: 10/05/2009 Document Revised: 01/10/2016 Document Reviewed: 07/05/2015 Elsevier Interactive Patient Education  [employer].   If you have lab work done today you will be contacted with your lab results within the next 2 weeks.  If you have not heard from [place] then please contact us. The fastest way to get your results is to register for My Chart.   IF you received an x-ray today, you will receive an invoice from [facility]. Please contact [facility] at [phone] with questions or concerns regarding your invoice.   IF  you received labwork today, you will receive an invoice from [facility]. Please contact [facility] at [phone] with questions or concerns regarding your invoice.   Our billing staff will not be able to assist you with questions regarding bills from these companies.  You will be contacted with the lab results as soon as they are available. The fastest way to get your results is to activate your My Chart  account. Instructions are located on the last page of this paperwork. If you have not heard from [place] regarding the results in 2 weeks, please contact this office.      I personally performed the services described in this documentation, which was scribed in my presence. The recorded information has been reviewed and considered for accuracy and completeness, addended by me as needed, and agree with information above.  Signed,   [name], MD [facility].  11/14/18 1:50 PM

## 2018-11-15 LAB — BASIC METABOLIC PANEL
BUN / CREAT RATIO: 15 (ref 9–20)
BUN: 11 mg/dL (ref 6–24)
CALCIUM: 9.4 mg/dL (ref 8.7–10.2)
CO2: 22 mmol/L (ref 20–29)
Chloride: 103 mmol/L (ref 96–106)
Creatinine, Ser: 0.71 mg/dL — ABNORMAL LOW (ref 0.76–1.27)
GFR, EST AFRICAN AMERICAN: 126 mL/min/{1.73_m2} (ref >59)
GFR, EST NON AFRICAN AMERICAN: 109 mL/min/{1.73_m2} (ref >59)
Glucose: 100 mg/dL — ABNORMAL HIGH (ref 65–99)
Potassium: 4.2 mmol/L (ref 3.5–5.2)
Sodium: 139 mmol/L (ref 134–144)

## 2018-11-15 LAB — URINE CULTURE: Organism ID, Bacteria: NO GROWTH

## 2018-11-15 LAB — PSA: PROSTATE SPECIFIC AG, SERUM: 0.9 ng/mL (ref 0.0–4.0)

## 2018-11-17 LAB — GC/CHLAMYDIA PROBE AMP
Chlamydia trachomatis, NAA: NEGATIVE
NEISSERIA GONORRHOEAE BY PCR: NEGATIVE

## 2019-01-07 ENCOUNTER — Telehealth: Payer: Self-pay | Admitting: Family Medicine

## 2019-01-07 DIAGNOSIS — M109 Gout, unspecified: Secondary | ICD-10-CM

## 2019-01-07 NOTE — Telephone Encounter (Signed)
Copied from CRM [id number]. Topic: Referral - Request for Referral >> Jan 07, 2019  9:00 AM Jaquita Rector A wrote: Has patient seen PCP for this complaint? Yes.   *If NO, is insurance requiring patient see PCP for this issue before PCP can refer them? Referral for which specialty: Rheumatologist Preferred provider/office: [facility] Reason for referral: Gout flare up Right foot toe  Ph# [phone]

## 2019-01-07 NOTE — Telephone Encounter (Signed)
Pt stated that he has switched insurances and he is not covered here at [facility] anymore. He has an appointment with Dr. Leanord Hawking office at [facility] in [place]. He would like to have a referral sent to this office for he feels he now needs a specialist.    Please advise on referral.  [facility] in [facility] [place] Phone: [phone] Fax: [phone]  Referral for which specialty: Rheumatologist Preferred provider/office: [facility] Gentile Reason for referral: Gout flare up Right foot toe  Thanks, Peterson Ao

## 2019-01-07 NOTE — Telephone Encounter (Signed)
Done

## 2019-06-28 DIAGNOSIS — H40003 Preglaucoma, unspecified, bilateral: Secondary | ICD-10-CM | POA: Insufficient documentation

## 2020-12-27 ENCOUNTER — Other Ambulatory Visit: Payer: Self-pay

## 2020-12-27 ENCOUNTER — Encounter: Payer: Self-pay | Admitting: Family Medicine

## 2020-12-27 ENCOUNTER — Ambulatory Visit (INDEPENDENT_AMBULATORY_CARE_PROVIDER_SITE_OTHER): Payer: 59 | Admitting: Family Medicine

## 2020-12-27 VITALS — BP 144/92 | HR 60 | Temp 98.2°F | Ht 66.54 in | Wt 166.0 lb

## 2020-12-27 DIAGNOSIS — Z6826 Body mass index (BMI) 26.0-26.9, adult: Secondary | ICD-10-CM

## 2020-12-27 DIAGNOSIS — E782 Mixed hyperlipidemia: Secondary | ICD-10-CM

## 2020-12-27 DIAGNOSIS — Z1329 Encounter for screening for other suspected endocrine disorder: Secondary | ICD-10-CM

## 2020-12-27 DIAGNOSIS — M109 Gout, unspecified: Secondary | ICD-10-CM

## 2020-12-27 DIAGNOSIS — R350 Frequency of micturition: Secondary | ICD-10-CM | POA: Diagnosis not present

## 2020-12-27 DIAGNOSIS — Z135 Encounter for screening for eye and ear disorders: Secondary | ICD-10-CM

## 2020-12-27 DIAGNOSIS — I10 Essential (primary) hypertension: Secondary | ICD-10-CM

## 2020-12-27 DIAGNOSIS — Z1159 Encounter for screening for other viral diseases: Secondary | ICD-10-CM

## 2020-12-27 DIAGNOSIS — Z125 Encounter for screening for malignant neoplasm of prostate: Secondary | ICD-10-CM

## 2020-12-27 DIAGNOSIS — Z130 Encounter for screening for diseases of the blood and blood-forming organs and certain disorders involving the immune mechanism: Secondary | ICD-10-CM

## 2020-12-27 DIAGNOSIS — Z131 Encounter for screening for diabetes mellitus: Secondary | ICD-10-CM | POA: Diagnosis not present

## 2020-12-27 DIAGNOSIS — Z1321 Encounter for screening for nutritional disorder: Secondary | ICD-10-CM

## 2020-12-27 LAB — POCT URINALYSIS DIP (MANUAL ENTRY)
Bilirubin, UA: NEGATIVE
Blood, UA: NEGATIVE
Glucose, UA: NEGATIVE mg/dL
Ketones, POC UA: NEGATIVE mg/dL
Leukocytes, UA: NEGATIVE
Nitrite, UA: NEGATIVE
Protein Ur, POC: NEGATIVE mg/dL
Spec Grav, UA: 1.01 (ref 1.010–1.025)
Urobilinogen, UA: 0.2 E.U./dL
pH, UA: 6.5 (ref 5.0–8.0)

## 2020-12-27 MED ORDER — VALSARTAN 80 MG PO TABS: 80.0000 mg | ORAL_TABLET | Freq: Every day | ORAL | 3 refills | Status: DC

## 2020-12-27 MED ORDER — ALLOPURINOL 300 MG PO TABS: 300.0000 mg | ORAL_TABLET | Freq: Every day | ORAL | 3 refills | Status: DC

## 2020-12-27 MED ORDER — LOVASTATIN 40 MG PO TABS
40.0000 mg | ORAL_TABLET | Freq: Every day | ORAL | 3 refills | Status: DC
Start: 2020-12-27 — End: 2021-05-29

## 2020-12-27 NOTE — Patient Instructions (Addendum)
Preventing Hypertension Hypertension, commonly called high blood pressure, is when the force of blood pumping through the arteries is too strong. Arteries are blood vessels that carry blood from the heart throughout the body. Over time, hypertension can damage the arteries and decrease blood flow to important parts of the body, including the brain, heart, and kidneys. Often, hypertension does not cause symptoms until blood pressure is very high. For this reason, it is important to have your blood pressure checked on a regular basis. Hypertension can often be prevented with diet and lifestyle changes. If you already have hypertension, you can control it with diet and lifestyle changes, as well as medicine. What nutrition changes can be made? Maintain a healthy diet. This includes:  Eating less salt (sodium). Ask your health care provider how much sodium is safe for you to have. The general recommendation is to consume less than 1 tsp (2,300 mg) of sodium a day. ? Do not add salt to your food. ? Choose low-sodium options when grocery shopping and eating out.  Limiting fats in your diet. You can do this by eating low-fat or fat-free dairy products and by eating less red meat.  Eating more fruits, vegetables, and whole grains. Make a goal to eat: ? 1-2 cups of fresh fruits and vegetables each day. ? 3-4 servings of whole grains each day.  Avoiding foods and beverages that have added sugars.  Eating fish that contain healthy fats (omega-3 fatty acids), such as mackerel or salmon. If you need help putting together a healthy eating plan, try the DASH diet. This diet is high in fruits, vegetables, and whole grains. It is low in sodium, red meat, and added sugars. DASH stands for Dietary Approaches to Stop Hypertension. What lifestyle changes can be made?   Lose weight if you are overweight. Losing just 3?5% of your body weight can help prevent or control hypertension. ? For example, if your  present weight is 200 lb (91 kg), a loss of 3-5% of your weight means losing 6-10 lb (2.7-4.5 kg). ? Ask your health care provider to help you with a diet and exercise plan to safely lose weight.  Get enough exercise. Do at least 150 minutes of moderate-intensity exercise each week. ? You could do this in short exercise sessions several times a day, or you could do longer exercise sessions a few times a week. For example, you could take a brisk 10-minute walk or bike ride, 3 times a day, for 5 days a week.  Find ways to reduce stress, such as exercising, meditating, listening to music, or taking a yoga class. If you need help reducing stress, ask your health care provider.  Do not smoke. This includes e-cigarettes. Chemicals in tobacco and nicotine products raise your blood pressure each time you smoke. If you need help quitting, ask your health care provider.  Avoid alcohol. If you drink alcohol, limit alcohol intake to no more than 1 drink a day for nonpregnant women and 2 drinks a day for men. One drink equals 12 oz of beer, 5 oz of wine, or 1 oz of hard liquor. Why are these changes important? Diet and lifestyle changes can help you prevent hypertension, and they may make you feel better overall and improve your quality of life. If you have hypertension, making these changes will help you control it and help prevent major complications, such as:  Hardening and narrowing of arteries that supply blood to: ? Your heart. This can cause a  heart attack. ? Your brain. This can cause a stroke. ? Your kidneys. This can cause kidney failure.  Stress on your heart muscle, which can cause heart failure. What can I do to lower my risk?  Work with your health care provider to make a hypertension prevention plan that works for you. Follow your plan and keep all follow-up visits as told by your health care provider.  Learn how to check your blood pressure at home. Make sure that you know your personal  target blood pressure, as told by your health care provider. How is this treated? In addition to diet and lifestyle changes, your health care provider may recommend medicines to help lower your blood pressure. You may need to try a few different medicines to find what works best for you. You also may need to take more than one medicine. Take over-the-counter and prescription medicines only as told by your health care provider. Where to find support Your health care provider can help you prevent hypertension and help you keep your blood pressure at a healthy level. Your local hospital or your community may also provide support services and prevention programs. The American Heart Association offers an online support network at: https://www.lee.net/ Where to find more information Learn more about hypertension from:  National Heart, Lung, and Blood Institute: https://www.peterson.org/  Centers for Disease Control and Prevention: AboutHD.co.nz  American Academy of Family Physicians: http://familydoctor.org/familydoctor/en/diseases-conditions/high-blood-pressure.printerview.all.html Learn more about the DASH diet from:  National Heart, Lung, and Blood Institute: WedMap.it Contact a health care provider if:  You think you are having a reaction to medicines you have taken.  You have recurrent headaches or feel dizzy.  You have swelling in your ankles.  You have trouble with your vision. Summary  Hypertension often does not cause any symptoms until blood pressure is very high. It is important to get your blood pressure checked regularly.  Diet and lifestyle changes are the most important steps in preventing hypertension.  By keeping your blood pressure in a healthy range, you can prevent complications like heart attack, heart failure, stroke, and kidney failure.  Work with your health  care provider to make a hypertension prevention plan that works for you. This information is not intended to replace advice given to you by your health care provider. Make sure you discuss any questions you have with your health care provider. Document Revised: 04/02/2019 Document Reviewed: 08/19/2016 Elsevier Patient Education  2020 Elsevier Inc.  DASH Eating Plan DASH stands for "Dietary Approaches to Stop Hypertension." The DASH eating plan is a healthy eating plan that has been shown to reduce high blood pressure (hypertension). It may also reduce your risk for type 2 diabetes, heart disease, and stroke. The DASH eating plan may also help with weight loss. What are tips for following this plan?  General guidelines  Avoid eating more than 2,300 mg (milligrams) of salt (sodium) a day. If you have hypertension, you may need to reduce your sodium intake to 1,500 mg a day.  Limit alcohol intake to no more than 1 drink a day for nonpregnant women and 2 drinks a day for men. One drink equals 12 oz of beer, 5 oz of wine, or 1 oz of hard liquor.  Work with your health care provider to maintain a healthy body weight or to lose weight. Ask what an ideal weight is for you.  Get at least 30 minutes of exercise that causes your heart to beat faster (aerobic exercise) most days of the week.  Activities may include walking, swimming, or biking.  Work with your health care provider or diet and nutrition specialist (dietitian) to adjust your eating plan to your individual calorie needs. Reading food labels   Check food labels for the amount of sodium per serving. Choose foods with less than 5 percent of the Daily Value of sodium. Generally, foods with less than 300 mg of sodium per serving fit into this eating plan.  To find whole grains, look for the word "whole" as the first word in the ingredient list. Shopping  Buy products labeled as "low-sodium" or "no salt added."  Buy fresh foods. Avoid  canned foods and premade or frozen meals. Cooking  Avoid adding salt when cooking. Use salt-free seasonings or herbs instead of table salt or sea salt. Check with your health care provider or pharmacist before using salt substitutes.  Do not fry foods. Cook foods using healthy methods such as baking, boiling, grilling, and broiling instead.  Cook with heart-healthy oils, such as olive, canola, soybean, or sunflower oil. Meal planning  Eat a balanced diet that includes: ? 5 or more servings of fruits and vegetables each day. At each meal, try to fill half of your plate with fruits and vegetables. ? Up to 6-8 servings of whole grains each day. ? Less than 6 oz of lean meat, poultry, or fish each day. A 3-oz serving of meat is about the same size as a deck of cards. One egg equals 1 oz. ? 2 servings of low-fat dairy each day. ? A serving of nuts, seeds, or beans 5 times each week. ? Heart-healthy fats. Healthy fats called Omega-3 fatty acids are found in foods such as flaxseeds and coldwater fish, like sardines, salmon, and mackerel.  Limit how much you eat of the following: ? Canned or prepackaged foods. ? Food that is high in trans fat, such as fried foods. ? Food that is high in saturated fat, such as fatty meat. ? Sweets, desserts, sugary drinks, and other foods with added sugar. ? Full-fat dairy products.  Do not salt foods before eating.  Try to eat at least 2 vegetarian meals each week.  Eat more home-cooked food and less restaurant, buffet, and fast food.  When eating at a restaurant, ask that your food be prepared with less salt or no salt, if possible. What foods are recommended? The items listed may not be a complete list. Talk with your dietitian about what dietary choices are best for you. Grains Whole-grain or whole-wheat bread. Whole-grain or whole-wheat pasta. Brown rice. Sean Watkins. Bulgur. Whole-grain and low-sodium cereals. Pita bread. Low-fat, low-sodium  crackers. Whole-wheat flour tortillas. Vegetables Fresh or frozen vegetables (raw, steamed, roasted, or grilled). Low-sodium or reduced-sodium tomato and vegetable juice. Low-sodium or reduced-sodium tomato sauce and tomato paste. Low-sodium or reduced-sodium canned vegetables. Fruits All fresh, dried, or frozen fruit. Canned fruit in natural juice (without added sugar). Meat and other protein foods Skinless chicken or [place]. Ground chicken or [place]. Pork with fat trimmed off. Fish and seafood. Egg whites. Dried beans, peas, or lentils. Unsalted nuts, nut butters, and seeds. Unsalted canned beans. Lean cuts of beef with fat trimmed off. Low-sodium, lean deli meat. Dairy Low-fat (1%) or fat-free (skim) milk. Fat-free, low-fat, or reduced-fat cheeses. Nonfat, low-sodium ricotta or cottage cheese. Low-fat or nonfat yogurt. Low-fat, low-sodium cheese. Fats and oils Soft margarine without trans fats. Vegetable oil. Low-fat, reduced-fat, or light mayonnaise and salad dressings (reduced-sodium). Canola, safflower, olive, soybean, and sunflower oils. Avocado.  Seasoning and other foods Herbs. Spices. Seasoning mixes without salt. Unsalted popcorn and pretzels. Fat-free sweets. What foods are not recommended? The items listed may not be a complete list. Talk with your dietitian about what dietary choices are best for you. Grains Baked goods made with fat, such as croissants, muffins, or some breads. Dry pasta or rice meal packs. Vegetables Creamed or fried vegetables. Vegetables in a cheese sauce. Regular canned vegetables (not low-sodium or reduced-sodium). Regular canned tomato sauce and paste (not low-sodium or reduced-sodium). Regular tomato and vegetable juice (not low-sodium or reduced-sodium). Sean Watkins. Olives. Fruits Canned fruit in a light or heavy syrup. Fried fruit. Fruit in cream or butter sauce. Meat and other protein foods Fatty cuts of meat. Ribs. Fried meat. Sean Watkins. Sausage. Bologna and  other processed lunch meats. Salami. Fatback. Hotdogs. Bratwurst. Salted nuts and seeds. Canned beans with added salt. Canned or smoked fish. Whole eggs or egg yolks. Chicken or [place] with skin. Dairy Whole or 2% milk, cream, and half-and-half. Whole or full-fat cream cheese. Whole-fat or sweetened yogurt. Full-fat cheese. Nondairy creamers. Whipped toppings. Processed cheese and cheese spreads. Fats and oils Butter. Stick margarine. Lard. Shortening. Ghee. Bacon fat. Tropical oils, such as coconut, palm kernel, or palm oil. Seasoning and other foods Salted popcorn and pretzels. Onion salt, garlic salt, seasoned salt, table salt, and sea salt. Worcestershire sauce. Tartar sauce. Barbecue sauce. Teriyaki sauce. Soy sauce, including reduced-sodium. Steak sauce. Canned and packaged gravies. Fish sauce. Oyster sauce. Cocktail sauce. Horseradish that you find on the shelf. Ketchup. Mustard. Meat flavorings and tenderizers. Bouillon cubes. Hot sauce and Tabasco sauce. Premade or packaged marinades. Premade or packaged taco seasonings. Relishes. Regular salad dressings. Where to find more information:  National Heart, Lung, and Blood Institute: PopSteam.is  American Heart Association: www.heart.org Summary  The DASH eating plan is a healthy eating plan that has been shown to reduce high blood pressure (hypertension). It may also reduce your risk for type 2 diabetes, heart disease, and stroke.  With the DASH eating plan, you should limit salt (sodium) intake to 2,300 mg a day. If you have hypertension, you may need to reduce your sodium intake to 1,500 mg a day.  When on the DASH eating plan, aim to eat more fresh fruits and vegetables, whole grains, lean proteins, low-fat dairy, and heart-healthy fats.  Work with your health care provider or diet and nutrition specialist (dietitian) to adjust your eating plan to your individual calorie needs. This information is not intended to replace advice  given to you by your health care provider. Make sure you discuss any questions you have with your health care provider. Document Revised: 11/21/2017 Document Reviewed: 12/02/2016 Elsevier Patient Education  [employer].    If you have lab work done today you will be contacted with your lab results within the next 2 weeks.  If you have not heard from [place] then please contact us. The fastest way to get your results is to register for My Chart.   IF you received an x-ray today, you will receive an invoice from [facility]. Please contact [facility] at [phone] with questions or concerns regarding your invoice.   IF you received labwork today, you will receive an invoice from [facility]. Please contact [facility] at [phone] with questions or concerns regarding your invoice.   Our billing staff will not be able to assist you with questions regarding bills from these companies.  You will be contacted with the lab results as soon as  they are available. The fastest way to get your results is to activate your My Chart account. Instructions are located on the last page of this paperwork. If you have not heard from [place] regarding the results in 2 weeks, please contact this office.

## 2020-12-27 NOTE — Progress Notes (Signed)
1/5/20222:31 PM  Sean Watkins 1966-02-27, 55 y.o., male [id number]  Chief Complaint  Patient presents with  . Gout    Follow up , reported multiple symptoms with allopurinol for gout dizzyness, chills, frequent urination, dark urine, needle like sensation on skin -   . referral to eye for glaucoma check    Pt states he was advised by an eye doctor at [facility] that he has symptoms of glaucoma and is requesting referral    HPI:   Patient is a 54 y.o. male with past medical history significant for gout presents today for new patient eval.  Has been having issues with frequent urination and abdominal pain No gout flares for 2.5 years Uric acid levels were high last year Was recommended to take allopurinol 369m Also taking Atorvastatin 267mdaily Allopurinol gave him vertigo, dizziness and itching Doesn't feel thirsty during the day Still taking allopurinol and atorvastatin Has  A sister with kidney failure  BP Readings from Last 3 Encounters:  12/27/20 (!) 144/92  11/14/18 127/76  10/08/18 138/85     Health Maintenance  Topic Date Due  . INFLUENZA VACCINE  03/22/2021 (Originally 07/23/2020)  . TETANUS/TDAP  12/23/2021  . COLONOSCOPY (Pts [id number] coverage will need to be confirmed)  03/06/2030  . Hepatitis C Screening  Completed  . HIV Screening  Completed   Wt Readings from Last 3 Encounters:  12/27/20 166 lb (75.3 kg)  11/14/18 175 lb 3.2 oz (79.5 kg)  10/08/18 175 lb 6.4 oz (79.6 kg)     Depression screen [facility] 11/14/2018 10/08/2018 03/11/2018  Decreased Interest 0 0 0  Down, Depressed, Hopeless 0 0 0  PHQ - 2 Score 0 0 0    Fall Risk  12/27/2020 11/14/2018 10/08/2018 03/11/2018 04/28/2017  Falls in the past year? 0 0 No No No  Number falls in past yr: 0 - - - -  Injury with Fall? 0 - - - -  Follow up Falls evaluation completed - - - -     Allergies  Allergen Reactions  . 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 Indocin [Indomethacin] Rash    POSSIBLY due to indocin    Prior to Admission medications   Medication Sig Start Date End Date Taking? Authorizing Provider  allopurinol (ZYLOPRIM) 300 MG tablet Take 300 mg by mouth daily.   Yes [provider]  atorvastatin (LIPITOR) 20 MG tablet Take 20 mg by mouth daily.   Yes [provider]    Past Medical History:  Diagnosis Date  . Allergy   . Arthritis   . Glaucoma   . Gout     History reviewed. No pertinent surgical history.  Social History   Tobacco Use  . Smoking status: Never Smoker  . Smokeless tobacco: Never Used  Substance Use Topics  . Alcohol use: No    Family History  Problem Relation Age of Onset  . Heart disease Mother   . Diabetes Mother   . Stroke Father     Review of Systems  Constitutional: Negative for chills, fever and malaise/fatigue.  Eyes: Negative for blurred vision and double vision.  Respiratory: Negative for cough, shortness of breath and wheezing.   Cardiovascular: Negative for chest pain, palpitations and leg swelling.  Gastrointestinal: Negative for abdominal pain, blood in stool, constipation, diarrhea, heartburn, nausea and vomiting.  Genitourinary: Negative for dysuria, frequency and hematuria.  Musculoskeletal: Negative for  back pain and joint pain.  Skin: Negative for rash.  Neurological: Negative for dizziness, weakness and headaches.     OBJECTIVE:  Today's Vitals   12/27/20 1320  BP: (!) 144/92  Pulse: 60  Temp: 98.2 F (36.8 C)  SpO2: 99%  Weight: 166 lb (75.3 kg)  Height: 5' 6.54" (1.69 m)   Body mass index is 26.36 kg/m.   Physical Exam Vitals reviewed.  Constitutional:      Appearance: Normal appearance.  HENT:     Head: Normocephalic and atraumatic.  Eyes:     Conjunctiva/sclera: Conjunctivae normal.     Pupils: Pupils are equal, round, and reactive to light.  Cardiovascular:     Rate and Rhythm: Normal rate and regular rhythm.      Pulses: Normal pulses.     Heart sounds: Normal heart sounds. No murmur heard. No friction rub. No gallop.   Pulmonary:     Effort: Pulmonary effort is normal. No respiratory distress.     Breath sounds: Normal breath sounds. No stridor. No wheezing or rales.  Abdominal:     General: Bowel sounds are normal. There is no distension.     Palpations: Abdomen is soft. There is no mass.     Tenderness: There is no abdominal tenderness. There is no right CVA tenderness, left CVA tenderness or guarding.  Musculoskeletal:     Right lower leg: No edema.     Left lower leg: No edema.  Skin:    General: Skin is warm and dry.  Neurological:     General: No focal deficit present.     Mental Status: He is alert and oriented to person, place, and time.  Psychiatric:        Mood and Affect: Mood normal.        Behavior: Behavior normal.     Results for orders placed or performed in visit on 12/27/20 (from the past 24 hour(s))  POCT urinalysis dipstick     Status: None   Collection Time: 12/27/20  2:16 PM  Result Value Ref Range   Color, UA yellow yellow   Clarity, UA clear clear   Glucose, UA negative negative mg/dL   Bilirubin, UA negative negative   Ketones, POC UA negative negative mg/dL   Spec Grav, UA 1.010 1.010 - 1.025   Blood, UA negative negative   pH, UA 6.5 5.0 - 8.0   Protein Ur, POC negative negative mg/dL   Urobilinogen, UA 0.2 0.2 or 1.0 E.U./dL   Nitrite, UA Negative Negative   Leukocytes, UA Negative Negative    No results found.   ASSESSMENT and PLAN  Problem List Items Addressed This Visit   None   Visit Diagnoses    Gout, unspecified cause, unspecified chronicity, unspecified site    -  Primary   Relevant Medications   allopurinol (ZYLOPRIM) 300 MG tablet   Other Relevant Orders   Uric Acid   Screening for diabetes mellitus       Relevant Orders   CMP14+EGFR   Hemoglobin A1c   Screening for thyroid disorder       Relevant Orders   TSH   Encounter for  vitamin deficiency screening       Relevant Orders   Vitamin D, 25-hydroxy   Screening, anemia, deficiency, iron       Relevant Orders   CBC   Prostate cancer screening       Relevant Orders   PSA   Encounter for hepatitis C screening  test for low risk patient       Relevant Orders   Hepatitis C antibody   BMI 26.0-26.9,adult       Frequent urination       Relevant Orders   POCT urinalysis dipstick (Completed)   Essential hypertension       Relevant Medications   valsartan (DIOVAN) 80 MG tablet   lovastatin (MEVACOR) 40 MG tablet   Other Relevant Orders   Amb ref to Medical Nutrition Therapy-MNT   Mixed hyperlipidemia       Relevant Medications   valsartan (DIOVAN) 80 MG tablet   lovastatin (MEVACOR) 40 MG tablet   Other Relevant Orders   Lipid Panel   Amb ref to Medical Nutrition Therapy-MNT   Screening for glaucoma       Relevant Orders   Ambulatory referral to Ophthalmology      Return in about 3 months (around 03/27/2021).   Huston Foley Daveyon Kitchings, FNP-BC [facility] [place], [place] [address] Ph.  [phone] Fax [phone]

## 2020-12-28 LAB — CMP14+EGFR
ALT: 24 IU/L (ref 0–44)
AST: 26 IU/L (ref 0–40)
Albumin/Globulin Ratio: 1.9 (ref 1.2–2.2)
Albumin: 4.7 g/dL (ref 3.8–4.9)
Alkaline Phosphatase: 77 IU/L (ref 44–121)
BUN/Creatinine Ratio: 15 (ref 9–20)
BUN: 11 mg/dL (ref 6–24)
Bilirubin Total: 0.5 mg/dL (ref 0.0–1.2)
CO2: 24 mmol/L (ref 20–29)
Calcium: 9.5 mg/dL (ref 8.7–10.2)
Chloride: 101 mmol/L (ref 96–106)
Creatinine, Ser: 0.75 mg/dL — ABNORMAL LOW (ref 0.76–1.27)
GFR calc Af Amer: 121 mL/min/{1.73_m2} (ref >59)
GFR calc non Af Amer: 105 mL/min/{1.73_m2} (ref >59)
Globulin, Total: 2.5 g/dL (ref 1.5–4.5)
Glucose: 95 mg/dL (ref 65–99)
Potassium: 4.3 mmol/L (ref 3.5–5.2)
Sodium: 143 mmol/L (ref 134–144)
Total Protein: 7.2 g/dL (ref 6.0–8.5)

## 2020-12-28 LAB — CBC
Hematocrit: 44.3 % (ref 37.5–51.0)
Hemoglobin: 15.1 g/dL (ref 13.0–17.7)
MCH: 29.9 pg (ref 26.6–33.0)
MCHC: 34.1 g/dL (ref 31.5–35.7)
MCV: 88 fL (ref 79–97)
Platelets: 202 10*3/uL (ref 150–450)
RBC: 5.05 x10E6/uL (ref 4.14–5.80)
RDW: 13.7 % (ref 11.6–15.4)
WBC: 6.2 10*3/uL (ref 3.4–10.8)

## 2020-12-28 LAB — VITAMIN D 25 HYDROXY (VIT D DEFICIENCY, FRACTURES): Vit D, 25-Hydroxy: 14.9 ng/mL — ABNORMAL LOW (ref 30.0–100.0)

## 2020-12-28 LAB — LIPID PANEL
Chol/HDL Ratio: 3.7 ratio (ref 0.0–5.0)
Cholesterol, Total: 193 mg/dL (ref 100–199)
HDL: 52 mg/dL (ref >39)
LDL Chol Calc (NIH): 124 mg/dL — ABNORMAL HIGH (ref 0–99)
Triglycerides: 92 mg/dL (ref 0–149)
VLDL Cholesterol Cal: 17 mg/dL (ref 5–40)

## 2020-12-28 LAB — HEMOGLOBIN A1C
Est. average glucose Bld gHb Est-mCnc: 117 mg/dL
Hgb A1c MFr Bld: 5.7 % — ABNORMAL HIGH (ref 4.8–5.6)

## 2020-12-28 LAB — TSH: TSH: 1.89 u[IU]/mL (ref 0.450–4.500)

## 2020-12-28 LAB — HEPATITIS C ANTIBODY: Hep C Virus Ab: <0.1 s/co ratio (ref 0.0–0.9)

## 2020-12-28 LAB — URIC ACID: Uric Acid: 4.8 mg/dL (ref 3.8–8.4)

## 2020-12-28 LAB — PSA: Prostate Specific Ag, Serum: 0.9 ng/mL (ref 0.0–4.0)

## 2020-12-28 NOTE — Progress Notes (Signed)
If you could let Sean Watkins know his cholesterol remains elevated, continue his current medication as discussed. His A1c is elevated which places him in the prediabetes range (no medications are needed at this time) His uric acid is normal if he would like he can stop his allopurinol and we can recheck at his next appointment. His vitamin D is also low. I would recommend 2000 IU of OTC vitamin d3 daily with food.

## 2021-03-26 ENCOUNTER — Ambulatory Visit: Payer: 59 | Admitting: Family Medicine

## 2021-05-27 NOTE — Progress Notes (Addendum)
Subjective:    Sean Watkins - 55 y.o. male MRN [id number]  Date of birth: [birth year]  HPI  Sean Watkins is to establish care. Patient has a PMH significant for gout attack and glaucoma suspect of both eyes .   Current issues and/or concerns: 1. HYPERTENSION FOLLOW-UP:  Visit 12/27/2020 at Primary Care at [facility] per NP note: valsartan (DIOVAN) 80 MG tablet    lovastatin (MEVACOR) 40 MG tablet   Other Relevant Orders   Amb ref to Medical Nutrition Therapy-MNT   05/28/2021: Currently taking: see medication list   Med Adherence: []  Yes    [x]  No, reports he never began taking the blood pressure medication because he received a call from the NP's assistant telling him to go pick up medication from the pharmacy. Reports he was never given a clear explanation as to why he should take the medication. He became hesitant and never began medication. Was not able to follow-up because the [place] office closed.  Adherence with salt restriction (low-salt diet): [x]  Yes    []  No Exercise: Yes [x]  No []   Smoking []  Yes [x]  No SOB? []  Yes    [x]  No Chest Pain?: []  Yes    [x]  No Leg swelling?: []  Yes    [x]  No Headaches?: []  Yes    [x]  No Dizziness? [x]  Yes    []  No Comments: Reports he is concerned because he researched online and saw that blood pressure medications can cause gout flares. Reports he thought if he lost weight then he would not need to take blood pressure medication.    2. HYPERLIPIDEMIA FOLLOW-UP: Visit 12/27/2020 at Primary Care at [facility] per NP note: valsartan (DIOVAN) 80 MG tablet    lovastatin (MEVACOR) 40 MG tablet   Other Relevant Orders   Amb ref to Medical Nutrition Therapy-MNT   05/28/2021: Last Lipid Panel results: last obtained 12/27/2020.  Med Adherence: []  Yes    [x]  No,  Reports he never began taking the cholesterol medication because he received a call from the NP's assistant telling him to go pick up medication from the pharmacy. Reports he was never given a clear  explanation as to why he should take the medication. He became hesitant and never began medication. Would like cholesterol rechecked today. Was not able to follow-up because the [place] office closed.  Diet Adherence: [x]  Yes    []  No   3. GOUT FOLLOW-UP: Visit 12/27/2020 at Primary Care at [facility] per NP note: - Prescribed Allopurinol and uric acid checked.  - On 12/28/2020 provider responded to labs with "His uric acid is normal if he would like he can stop his allopurinol and we can recheck at his next appointment."  05/28/2021:  He has not been taking Allopurinol. Denies recent or current gout flare. He is watching what he is eating and drinking. Would like uric acid rechecked today. Was not able to follow-up because the [place] office closed.    4. VITAMIN D FOLLOW-UP: Visit 12/27/2020 at Primary Care at [facility] per NP note: His vitamin D is also low. I would recommend 2000 IU of OTC vitamin d3 daily with food.  05/28/2021: Taking over the counter supplements. Would like vitamin D rechecked.   ROS per HPI    Health Maintenance:  Health Maintenance Due  Topic Date Due  . Zoster Vaccines- Shingrix (1 of 2) Never done    Past Medical History: Patient Active Problem List   Diagnosis Date Noted  . Glaucoma suspect of both eyes 06/28/2019  .  Gout attack 07/16/2013    Social History   reports that he has never smoked. He has never used smokeless tobacco. He reports that he does not drink alcohol and does not use drugs.   Family History  family history includes Diabetes in his mother; Heart disease in his mother; Stroke in his father.   Medications: reviewed and updated   Objective:   Physical Exam BP (!) 145/89 (BP Location: Left Arm, Patient Position: Sitting, Cuff Size: Normal)   Pulse 60   Temp 97.7 F (36.5 C)   Resp 15   Ht 5' 6.54" (1.69 m)   Wt 155 lb 12.8 oz (70.7 kg)   SpO2 97%   BMI 24.74 kg/m  Physical Exam HENT:     Head: Normocephalic and atraumatic.  Eyes:      Extraocular Movements: Extraocular movements intact.     Conjunctiva/sclera: Conjunctivae normal.     Pupils: Pupils are equal, round, and reactive to light.  Cardiovascular:     Rate and Rhythm: Normal rate and regular rhythm.     Pulses: Normal pulses.     Heart sounds: Normal heart sounds.  Pulmonary:     Effort: Pulmonary effort is normal.     Breath sounds: Normal breath sounds.  Musculoskeletal:     Cervical back: Normal range of motion and neck supple.  Neurological:     General: No focal deficit present.     Mental Status: He is alert and oriented to person, place, and time.  Psychiatric:        Mood and Affect: Mood normal.        Behavior: Behavior normal.         Assessment & Plan:  1. Encounter to establish care: - Patient presents today to establish care.  - Return for annual physical examination, labs, and health maintenance. Arrive fasting meaning having no food for at least 8 hours prior to appointment. You may have only water or black coffee. Please take scheduled medications as normal.  2. Essential hypertension: - Begin Valsartan as prescribed.  - Counseled on blood pressure goal of less than 130/80, low-sodium, DASH diet, medication compliance, 150 minutes of moderate intensity exercise per week as tolerated. Discussed medication compliance, adverse effects. - Follow-up in 2 weeks or sooner if needed with primary provider for blood pressure check. Write down your blood pressure readings each day and bring those results along with your home blood pressure monitor to your appointment. - valsartan (DIOVAN) 40 MG tablet; Take 1 tablet (40 mg total) by mouth daily.  Dispense: 30 tablet; Refill: 0  3. Mixed hyperlipidemia: - Lipid panel to screen for high cholesterol.  - Lipid panel   4. Gout, unspecified cause, unspecified chronicity, unspecified site: - Uric acid to screen for gout. - Uric Acid  5. Vitamin D deficiency: - Vitamin D, 25-hydroxy to screen for  deficiency. - Vitamin D, 25-hydroxy     Patient was given clear instructions to go to Emergency Department or return to medical center if symptoms don't improve, worsen, or new problems develop.The patient verbalized understanding.  I discussed the assessment and treatment plan with the patient. The patient was provided an opportunity to ask questions and all were answered. The patient agreed with the plan and demonstrated an understanding of the instructions.   The patient was advised to call back or seek an in-person evaluation if the symptoms worsen or if the condition fails to improve as anticipated.    Ricky Stabs, NP 05/28/2021,  10:25 AM Primary Care at [facility]

## 2021-05-28 ENCOUNTER — Encounter (INDEPENDENT_AMBULATORY_CARE_PROVIDER_SITE_OTHER): Payer: Self-pay

## 2021-05-28 ENCOUNTER — Encounter: Payer: Self-pay | Admitting: Family

## 2021-05-28 ENCOUNTER — Other Ambulatory Visit: Payer: Self-pay

## 2021-05-28 ENCOUNTER — Ambulatory Visit (INDEPENDENT_AMBULATORY_CARE_PROVIDER_SITE_OTHER): Payer: 59 | Admitting: Family

## 2021-05-28 VITALS — BP 145/89 | HR 60 | Temp 97.7°F | Resp 15 | Ht 66.54 in | Wt 155.8 lb

## 2021-05-28 DIAGNOSIS — E782 Mixed hyperlipidemia: Secondary | ICD-10-CM

## 2021-05-28 DIAGNOSIS — M109 Gout, unspecified: Secondary | ICD-10-CM | POA: Diagnosis not present

## 2021-05-28 DIAGNOSIS — Z7689 Persons encountering health services in other specified circumstances: Secondary | ICD-10-CM

## 2021-05-28 DIAGNOSIS — E559 Vitamin D deficiency, unspecified: Secondary | ICD-10-CM | POA: Insufficient documentation

## 2021-05-28 DIAGNOSIS — I10 Essential (primary) hypertension: Secondary | ICD-10-CM

## 2021-05-28 MED ORDER — VALSARTAN 40 MG PO TABS
40.0000 mg | ORAL_TABLET | Freq: Every day | ORAL | 0 refills | Status: DC
Start: 2021-05-28 — End: 2021-06-26

## 2021-05-28 NOTE — Patient Instructions (Signed)
Thank you for choosing Primary Care at [facility] for your medical home!    Markeis Whitehurst was seen by Rema Fendt, NP today.   Francoise Ceo primary care provider is Ricky Stabs, NP.   For the best care possible,  you should try to see Ricky Stabs, NP whenever you come to clinic.   We look forward to seeing you again soon!  If you have any questions about your visit today,  please call us at [phone]  Or feel free to reach your provider via MyChart.     Keeping you healthy   Get these tests  Blood pressure- Have your blood pressure checked once a year by your healthcare provider.  Normal blood pressure is 120/80.  Weight- Have your body mass index (BMI) calculated to screen for obesity.  BMI is a measure of body fat based on height and weight. You can also calculate your own BMI at https://www.west-esparza.com/.  Cholesterol- Have your cholesterol checked regularly starting at age 43, sooner may be necessary if you have diabetes, high blood pressure, if a family member developed heart diseases at an early age or if you smoke.   Chlamydia, HIV, and other sexual transmitted disease- Get screened each year until the age of 20 then within three months of each new sexual partner.  Diabetes- Have your blood sugar checked regularly if you have high blood pressure, high cholesterol, a family history of diabetes or if you are overweight.   Get these vaccines  Flu shot- Every fall.  Tetanus shot- Every 10 years.  Menactra- Single dose; prevents meningitis.   Take these steps  Don't smoke- If you do smoke, ask your healthcare provider about quitting. For tips on how to quit, go to www.smokefree.gov or call 1-800-QUIT-NOW.  Be physically active- Exercise 5 days a week for at least 30 minutes.  If you are not already physically active start slow and gradually work up to 30 minutes of moderate physical activity.  Examples of moderate activity include walking briskly, mowing  the yard, dancing, swimming bicycling, etc.  Eat a healthy diet- Eat a variety of healthy foods such as fruits, vegetables, low fat milk, low fat cheese, yogurt, lean meats, poultry, fish, beans, tofu, etc.  For more information on healthy eating, go to www.thenutritionsource.org  Drink alcohol in moderation- Limit alcohol intake two drinks or less a day.  Never drink and drive.  Dentist- Brush and floss teeth twice daily; visit your dentis twice a year.  Depression-Your emotional health is as important as your physical health.  If you're feeling down, losing interest in things you normally enjoy please talk with your healthcare provider.  Gun Safety- If you keep a gun in your home, keep it unloaded and with the safety lock on.  Bullets should be stored separately.  Helmet use- Always wear a helmet when riding a motorcycle, bicycle, rollerblading or skateboarding.  Safe sex- If you may be exposed to a sexually transmitted infection, use a condom  Seat belts- Seat bels can save your life; always wear one.  Smoke/Carbon Monoxide detectors- These detectors need to be installed on the appropriate level of your home.  Replace batteries at least once a year.  Skin Cancer- When out in the sun, cover up and use sunscreen SPF 15 or higher.  Violence- If anyone is threatening or hurting you, please tell your healthcare provider.

## 2021-05-28 NOTE — Progress Notes (Signed)
Establish care Fatigue-wants Vit D checked

## 2021-05-29 ENCOUNTER — Other Ambulatory Visit: Payer: Self-pay | Admitting: Family

## 2021-05-29 DIAGNOSIS — E785 Hyperlipidemia, unspecified: Secondary | ICD-10-CM | POA: Insufficient documentation

## 2021-05-29 LAB — LIPID PANEL
Chol/HDL Ratio: 4.5 ratio (ref 0.0–5.0)
Cholesterol, Total: 225 mg/dL — ABNORMAL HIGH (ref 100–199)
HDL: 50 mg/dL (ref >39)
LDL Chol Calc (NIH): 160 mg/dL — ABNORMAL HIGH (ref 0–99)
Triglycerides: 84 mg/dL (ref 0–149)
VLDL Cholesterol Cal: 15 mg/dL (ref 5–40)

## 2021-05-29 LAB — VITAMIN D 25 HYDROXY (VIT D DEFICIENCY, FRACTURES): Vit D, 25-Hydroxy: 43 ng/mL (ref 30.0–100.0)

## 2021-05-29 LAB — URIC ACID: Uric Acid: 6.8 mg/dL (ref 3.8–8.4)

## 2021-05-29 MED ORDER — LOVASTATIN 40 MG PO TABS: 40.0000 mg | ORAL_TABLET | Freq: Every day | ORAL | 0 refills | Status: DC

## 2021-05-29 NOTE — Progress Notes (Signed)
Vitamin D normal. Continue over-the-counter supplements.   No gout.   Cholesterol higher than expected. High cholesterol may increase risk of heart attack and/or stroke. Consider eating more fruits, vegetables, and lean baked meats such as chicken or fish. Moderate intensity exercise at least 150 minutes as tolerated per week may help as well.   Begin Lovastatin for high cholesterol. Encouraged to recheck in 3 months.   The 10-year ASCVD risk score Denman George DC Montez Hageman., et al., 2013) is: 7.8%   Values used to calculate the score:     Age: 55 years     Sex: Male     Is Non-Hispanic African American: No     Diabetic: No     Tobacco smoker: No     Systolic Blood Pressure: 145 mmHg     Is BP treated: Yes     HDL Cholesterol: 50 mg/dL     Total Cholesterol: 225 mg/dL

## 2021-06-18 ENCOUNTER — Encounter: Payer: 59 | Admitting: Family

## 2021-06-24 ENCOUNTER — Other Ambulatory Visit: Payer: Self-pay | Admitting: Family

## 2021-06-24 DIAGNOSIS — I10 Essential (primary) hypertension: Secondary | ICD-10-CM

## 2021-07-13 NOTE — Progress Notes (Signed)
    SUBJECTIVE:   CHIEF COMPLAINT / HPI: Establish care  Patient was recently seen at Primary Care at [facility] for establish care appointment on 05/28/21. Wants to establish here because his appointments kept getting pushed back.  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 No regular exercise. Denies smoking history, recreational drug use, and alcohol use per questionnaire. Feels safe in relationship.  History of gout Previously on allopurinol but was taken off by prior PCP as his uric acid level was normal.  Most recent gout attack was 2 and half years ago.  He has stopped drinking alcohol and had reduced meat in his diet.  Elevated BP Home BP readings: 120s/70s-80s Has not been taking any BP meds.  He was prescribed valsartan but never took this.  Constipation with rectal bleeding About 3 weeks ago, started noticing some bleeding after wiping. Reported rectal pain and itching. Reports small amount of purplish blood in stool for about 4 days 1 month ago. None since then and no further blood when wiping. Reported delayed BM after starting atorvastatin 3 months ago. Reports hard stools and difficulty emptying stools. Has daily BM. No pain with BM. Colonoscopy done about 1-2 years ago (03/06/2020 at [facility], noted to have small internal hemorrhoids but otherwise normal.  PERTINENT  PMH / PSH: HTN, gout, HLD, vitamin D deficiency  OBJECTIVE:   BP 136/90   Pulse (!) 52   Ht 5\' 6"  (1.676 m)   Wt 162 lb (73.5 kg)   SpO2 96%   BMI 26.15 kg/m   General: Overweight middle-aged male, NAD CV: RRR, no murmurs Pulm: CTAB, no wheezes or rales Abd: Soft, nontender, positive bowel sounds  ASSESSMENT/PLAN:   Essential hypertension Never started medications and home BP readings are normal.  We will continue to monitor for now.  Hyperlipidemia He has been taking atorvastatin 20 mg but was recently prescribed lovastatin 40 mg which he never started taking.  We will continue with atorvastatin 20  mg.  Vitamin D deficiency No longer taking vitamin D supplements as his most recent vitamin D level was normal.  Constipation With episode of rectal bleeding 1 month ago.  Most recent colonoscopy normal other than internal hemorrhoids, possibly contributing to rectal bleeding. - Miralax daily until BM soft, then prn - return precautions discussed, consider GI referral if continues to have rectal bleeding (per patient request)     , MD [facility]

## 2021-07-13 NOTE — Patient Instructions (Addendum)
It was nice seeing you today!  Taking Miralax every day until your stools become soft. Then take every day as needed to keep stools soft.  Please give [place] a call if you notice any more blood in your stool.  Take atorvastatin 20 mg daily.  Please arrive at least 15 minutes prior to your scheduled appointments.  Stay well, Littie Deeds, MD [facility] [phone]

## 2021-07-23 ENCOUNTER — Encounter: Payer: Self-pay | Admitting: Family Medicine

## 2021-07-23 ENCOUNTER — Other Ambulatory Visit: Payer: Self-pay

## 2021-07-23 ENCOUNTER — Ambulatory Visit (INDEPENDENT_AMBULATORY_CARE_PROVIDER_SITE_OTHER): Payer: 59 | Admitting: Family Medicine

## 2021-07-23 VITALS — BP 136/90 | HR 52 | Ht 66.0 in | Wt 162.0 lb

## 2021-07-23 DIAGNOSIS — I10 Essential (primary) hypertension: Secondary | ICD-10-CM

## 2021-07-23 DIAGNOSIS — K5900 Constipation, unspecified: Secondary | ICD-10-CM | POA: Insufficient documentation

## 2021-07-23 DIAGNOSIS — E785 Hyperlipidemia, unspecified: Secondary | ICD-10-CM | POA: Diagnosis not present

## 2021-07-23 DIAGNOSIS — E559 Vitamin D deficiency, unspecified: Secondary | ICD-10-CM | POA: Diagnosis not present

## 2021-07-23 MED ORDER — ATORVASTATIN CALCIUM 20 MG PO TABS: 20.0000 mg | ORAL_TABLET | Freq: Every day | ORAL | 3 refills | Status: DC

## 2021-07-23 MED ORDER — POLYETHYLENE GLYCOL 3350 17 GM/SCOOP PO POWD: 17.0000 g | Freq: Every day | ORAL | 1 refills | Status: DC

## 2021-07-23 NOTE — Assessment & Plan Note (Signed)
He has been taking atorvastatin 20 mg but was recently prescribed lovastatin 40 mg which he never started taking.  We will continue with atorvastatin 20 mg.

## 2021-07-23 NOTE — Assessment & Plan Note (Signed)
Never started medications and home BP readings are normal.  We will continue to monitor for now.

## 2021-07-23 NOTE — Assessment & Plan Note (Signed)
No longer taking vitamin D supplements as his most recent vitamin D level was normal.

## 2021-07-23 NOTE — Assessment & Plan Note (Signed)
With episode of rectal bleeding 1 month ago.  Most recent colonoscopy normal other than internal hemorrhoids, possibly contributing to rectal bleeding. - Miralax daily until BM soft, then prn - return precautions discussed, consider GI referral if continues to have rectal bleeding (per patient request)

## 2022-03-05 ENCOUNTER — Ambulatory Visit (INDEPENDENT_AMBULATORY_CARE_PROVIDER_SITE_OTHER): Payer: Managed Care, Other (non HMO) | Admitting: Family Medicine

## 2022-03-05 ENCOUNTER — Encounter: Payer: Self-pay | Admitting: Family Medicine

## 2022-03-05 ENCOUNTER — Other Ambulatory Visit: Payer: Self-pay

## 2022-03-05 VITALS — BP 147/88 | HR 57 | Ht 66.0 in | Wt 165.6 lb

## 2022-03-05 DIAGNOSIS — R339 Retention of urine, unspecified: Secondary | ICD-10-CM | POA: Diagnosis not present

## 2022-03-05 DIAGNOSIS — R351 Nocturia: Secondary | ICD-10-CM

## 2022-03-05 DIAGNOSIS — M79672 Pain in left foot: Secondary | ICD-10-CM

## 2022-03-05 DIAGNOSIS — Z8739 Personal history of other diseases of the musculoskeletal system and connective tissue: Secondary | ICD-10-CM | POA: Diagnosis not present

## 2022-03-05 DIAGNOSIS — E559 Vitamin D deficiency, unspecified: Secondary | ICD-10-CM

## 2022-03-05 DIAGNOSIS — M79671 Pain in right foot: Secondary | ICD-10-CM | POA: Insufficient documentation

## 2022-03-05 DIAGNOSIS — Z0000 Encounter for general adult medical examination without abnormal findings: Secondary | ICD-10-CM

## 2022-03-05 DIAGNOSIS — I10 Essential (primary) hypertension: Secondary | ICD-10-CM

## 2022-03-05 DIAGNOSIS — E785 Hyperlipidemia, unspecified: Secondary | ICD-10-CM

## 2022-03-05 MED ORDER — ATORVASTATIN CALCIUM 20 MG PO TABS: 20.0000 mg | ORAL_TABLET | Freq: Every day | ORAL | 3 refills | Status: DC

## 2022-03-05 NOTE — Assessment & Plan Note (Signed)
Most recent gout flare 3 years ago.  Checking uric acid per patient request. ?

## 2022-03-05 NOTE — Progress Notes (Signed)
? ? ?SUBJECTIVE:  ? ?CHIEF COMPLAINT / HPI:  ?Chief Complaint  ?Patient presents with  ? Annual Exam  ?  ?He is concerned about  his blood pressure. ?Walks 30 min 3 times/week. ?120s-130s systolic home readings. ?He is asking what he can do to reduce his blood pressure.  He does not want to take medication at this time. ? ?Nocturia 5-6 times at night, only 3 times at night if he reduces water intake at night. Feels incomplete emptying of bladder. Requests PSA to be checked. ? ?He has had bilateral  heel pain for several months which is worse after long periods of walking. No pain currently.  Denies significant pain with the first step of the day.  He has been using arch supports.  He is concerned whether there are any electrolyte abnormalities causing this. ? ?He has not had a gout attack in over 3 years ever since he quit drinking alcohol and reducing red meat.  He is requesting for his uric acid level to be checked. ? ?Denies smoking history.  Does not drink alcohol currently.  No concerns about weight.  Denies history of recreational drug use. ?No new sexual partners or history of STIs.  Feels safe in relationship. ? ?PERTINENT  PMH / PSH: HTN, HLD, gout ? ?Patient Care Team: ?Littie Deeds, MD as PCP - General (Family Medicine)  ? ?OBJECTIVE:  ? ?BP (!) 147/88   Pulse (!) 57   Ht 5\' 6"  (1.676 m)   Wt 165 lb 9.6 oz (75.1 kg)   SpO2 98%   BMI 26.73 kg/m?   ?Physical Exam ?Constitutional:   ?   General: He is not in acute distress. ?   Appearance: Normal appearance.  ?Cardiovascular:  ?   Rate and Rhythm: Normal rate and regular rhythm.  ?Pulmonary:  ?   Effort: Pulmonary effort is normal. No respiratory distress.  ?   Breath sounds: Normal breath sounds.  ?Musculoskeletal:  ?   Comments: Bilateral pes cavus. No foot tenderness. ROM without pain.  ?Neurological:  ?   Mental Status: He is alert.  ?  ? ?Depression screen [facility] 2/9 03/05/2022  ?Decreased Interest 0  ?Down, Depressed, Hopeless 0  ?PHQ - 2 Score 0   ?Altered sleeping 0  ?Tired, decreased energy 0  ?Change in appetite 0  ?Feeling bad or failure about yourself  0  ?Trouble concentrating 0  ?Moving slowly or fidgety/restless 0  ?Suicidal thoughts 0  ?PHQ-9 Score 0  ?Difficult doing work/chores -  ?  ? ?{Show previous vital signs (optional):23777} ? ? ? ?ASSESSMENT/PLAN:  ? ?Essential hypertension ?Elevated in office.  Shared decision making to trial lifestyle modifications and recheck in 1 month, consider starting antihypertensive if still elevated.  Check metabolic panel. ? ?Vitamin D deficiency ?Checking vitamin D levels per patient request.  Not on supplementation currently. ? ?Hyperlipidemia ?On statin, will recheck LDL ? ?History of gout ?Most recent gout flare 3 years ago.  Checking uric acid per patient request. ? ?Incomplete emptying of bladder ?With associated nocturia multiple times per night suggestive of BPH.  We will check PSA, consider alpha-blocker and referral to urology pending results. ? ?Heel pain, bilateral ?Ongoing for several months.  Suspect this is related to pes cavus.  Low suspicion for plantar fasciitis based on history and exam.  Advised to wear arch supports, consider sports medicine referral for custom orthotics if not improving. ?  ?HCM ?- checking CBC per patient request ?- Tdap, zoster, and influenza vaccines  due, discuss at follow-up ? ?Return in about 4 weeks (around 04/02/2022) for f/u HTN.  ? ?Littie Deeds, MD ?[facility]  ?

## 2022-03-05 NOTE — Assessment & Plan Note (Signed)
Checking vitamin D levels per patient request.  Not on supplementation currently. ?

## 2022-03-05 NOTE — Assessment & Plan Note (Addendum)
Elevated in office.  Shared decision making to trial lifestyle modifications and recheck in 1 month, consider starting antihypertensive if still elevated.  Check metabolic panel. ?

## 2022-03-05 NOTE — Assessment & Plan Note (Signed)
Ongoing for several months.  Suspect this is related to pes cavus.  Low suspicion for plantar fasciitis based on history and exam.  Advised to wear arch supports, consider sports medicine referral for custom orthotics if not improving. ?

## 2022-03-05 NOTE — Patient Instructions (Addendum)
It was nice seeing you today! ? ?Wear arch supports. Let me know if you want a referral for sports medicine. ? ?Work on increasing your walking and reducing sodium in your diet. ? ?Check your blood pressure at home and keep a log. Follow-up in 1 month. ? ?Stay well, ?Zola Button, MD ?[facility] ?([phone] ? ?-- ? ?Make sure to check out at the front desk before you leave today. ? ?Please arrive at least 15 minutes prior to your scheduled appointments. ? ?If you had blood work today, I will send you a MyChart message or a letter if results are normal. Otherwise, I will give you a call. ? ?If you had a referral placed, they will call you to set up an appointment. Please give [place] a call if you don't hear back in the next 2 weeks. ? ?If you need additional refills before your next appointment, please call your pharmacy first.  ?

## 2022-03-05 NOTE — Assessment & Plan Note (Signed)
With associated nocturia multiple times per night suggestive of BPH.  We will check PSA, consider alpha-blocker and referral to urology pending results. ?

## 2022-03-05 NOTE — Assessment & Plan Note (Signed)
On statin, will recheck LDL ?

## 2022-03-06 ENCOUNTER — Telehealth: Payer: Self-pay | Admitting: Family Medicine

## 2022-03-06 LAB — BASIC METABOLIC PANEL
BUN/Creatinine Ratio: 21 — ABNORMAL HIGH (ref 9–20)
BUN: 16 mg/dL (ref 6–24)
CO2: 23 mmol/L (ref 20–29)
Calcium: 9.2 mg/dL (ref 8.7–10.2)
Chloride: 103 mmol/L (ref 96–106)
Creatinine, Ser: 0.77 mg/dL (ref 0.76–1.27)
Glucose: 96 mg/dL (ref 70–99)
Potassium: 4.2 mmol/L (ref 3.5–5.2)
Sodium: 139 mmol/L (ref 134–144)
eGFR: 106 mL/min/{1.73_m2} (ref >59)

## 2022-03-06 LAB — CBC
Hematocrit: 43.2 % (ref 37.5–51.0)
Hemoglobin: 14.3 g/dL (ref 13.0–17.7)
MCH: 28.9 pg (ref 26.6–33.0)
MCHC: 33.1 g/dL (ref 31.5–35.7)
MCV: 87 fL (ref 79–97)
Platelets: 205 10*3/uL (ref 150–450)
RBC: 4.94 x10E6/uL (ref 4.14–5.80)
RDW: 13.8 % (ref 11.6–15.4)
WBC: 7.3 10*3/uL (ref 3.4–10.8)

## 2022-03-06 LAB — URIC ACID: Uric Acid: 8.1 mg/dL (ref 3.8–8.4)

## 2022-03-06 LAB — VITAMIN D 25 HYDROXY (VIT D DEFICIENCY, FRACTURES): Vit D, 25-Hydroxy: 28.3 ng/mL — ABNORMAL LOW (ref 30.0–100.0)

## 2022-03-06 LAB — PSA: Prostate Specific Ag, Serum: 1.2 ng/mL (ref 0.0–4.0)

## 2022-03-06 LAB — LDL CHOLESTEROL, DIRECT: LDL Direct: 91 mg/dL (ref 0–99)

## 2022-03-06 MED ORDER — CHOLECALCIFEROL 25 MCG (1000 UT) PO CAPS: 1000.0000 [IU] | ORAL_CAPSULE | Freq: Every day | ORAL | 1 refills | Status: DC

## 2022-03-06 MED ORDER — ATORVASTATIN CALCIUM 20 MG PO TABS: 20.0000 mg | ORAL_TABLET | Freq: Every day | ORAL | 3 refills | Status: DC

## 2022-03-06 NOTE — Telephone Encounter (Signed)
Discussed lab results with patient. ? ?Advised to start vitamin D daily supplement. ? ?Offered to schedule follow-up appointment for elevated BP but patient declined. ?

## 2022-03-13 ENCOUNTER — Encounter: Payer: Self-pay | Admitting: Emergency Medicine

## 2022-03-14 ENCOUNTER — Telehealth: Payer: Self-pay

## 2022-03-14 NOTE — Telephone Encounter (Signed)
Patient calls nurse line reporting he saw his recent lab work and noticed his BUN was high.  ? ?Patient reports his sister died of kidney disease and this scares him. Patient reports he would like to drink more water and have labs redrawn. Patient also wants to discuss this in more detail with PCP.  ? ?Patient scheduled for 4/5. ?

## 2022-03-21 NOTE — Patient Instructions (Addendum)
It was nice seeing you today! ? ?Keep checking your blood pressure at home a few times a week and write the readings down. ? ?Stay well, ?Littie Deeds, MD ?[facility] ?([phone] ? ?-- ? ?Make sure to check out at the front desk before you leave today. ? ?Please arrive at least 15 minutes prior to your scheduled appointments. ? ?If you had blood work today, I will send you a MyChart message or a letter if results are normal. Otherwise, I will give you a call. ? ?If you had a referral placed, they will call you to set up an appointment. Please give [place] a call if you don't hear back in the next 2 weeks. ? ?If you need additional refills before your next appointment, please call your pharmacy first.  ?

## 2022-03-21 NOTE — Progress Notes (Signed)
? ? ?  SUBJECTIVE:  ? ?CHIEF COMPLAINT / HPI:  ?Chief Complaint  ?Patient presents with  ? Labs Only  ?  ?HTN ?Not on any meds. Elevated at last visit, decision to trial lifestyle modifications. Patient called RN line in the interim concerned about elevated BUN/Cr ratio of 21 (normal range 9-20), he is concerned about kidney disease because sister died of kidney disease. He wants labs redrawn. ?He has obtained a BP cuff in the interim but is unsure if he is using it correctly. He has sometimes had elevated readings in the 140s but also normal readings 120s-130s. ? ?Patient is planning to transfer his care to Dr. Mitchel Honour who had been taking care of him previously. He has an upcoming appointment in 2 months. ? ?PERTINENT  PMH / PSH: HTN ? ?Patient Care Team: ?Zola Button, MD as PCP - General (Family Medicine)  ? ?OBJECTIVE:  ? ?BP 125/84   Pulse (!) 52   Ht 5\' 6"  (1.676 m)   Wt 165 lb 3.2 oz (74.9 kg)   SpO2 100%   BMI 26.66 kg/m?   ?Physical Exam ?Constitutional:   ?   General: He is not in acute distress. ?Cardiovascular:  ?   Rate and Rhythm: Normal rate and regular rhythm.  ?Pulmonary:  ?   Effort: Pulmonary effort is normal. No respiratory distress.  ?   Breath sounds: Normal breath sounds.  ?Neurological:  ?   Mental Status: He is alert.  ?  ? ? ?  03/27/2022  ?  9:13 AM  ?Depression screen PHQ 2/9  ?Decreased Interest 0  ?Down, Depressed, Hopeless 0  ?PHQ - 2 Score 0  ?Altered sleeping 0  ?Tired, decreased energy 0  ?Change in appetite 0  ?Feeling bad or failure about yourself  0  ?Trouble concentrating 0  ?Moving slowly or fidgety/restless 0  ?Suicidal thoughts 0  ?PHQ-9 Score 0  ?Difficult doing work/chores Not difficult at all  ?  ? ?{Show previous vital signs (optional):23777} ? ? ? ?ASSESSMENT/PLAN:  ? ?Essential hypertension ?Well controlled in office with no medications currently. I reassured patient that his kidney function is normal but will repeat BMP per patient request. Counseling provided  (avoid NSAIDs, monitor BP, diet/exercise). Advised to log his BP readings and bring them to his upcoming visit with Dr. Mitchel Honour. ?  ? ?Return if symptoms worsen or fail to improve.  ? ?Zola Button, MD ?[place]  ?

## 2022-03-27 ENCOUNTER — Ambulatory Visit: Payer: Managed Care, Other (non HMO) | Admitting: Family Medicine

## 2022-03-27 ENCOUNTER — Encounter: Payer: Self-pay | Admitting: Family Medicine

## 2022-03-27 VITALS — BP 125/84 | HR 52 | Ht 66.0 in | Wt 165.2 lb

## 2022-03-27 DIAGNOSIS — I10 Essential (primary) hypertension: Secondary | ICD-10-CM

## 2022-03-27 NOTE — Assessment & Plan Note (Signed)
Well controlled in office with no medications currently. I reassured patient that his kidney function is normal but will repeat BMP per patient request. Counseling provided (avoid NSAIDs, monitor BP, diet/exercise). Advised to log his BP readings and bring them to his upcoming visit with Dr. Alvy Bimler. ?

## 2022-03-28 LAB — BASIC METABOLIC PANEL
BUN/Creatinine Ratio: 21 — ABNORMAL HIGH (ref 9–20)
BUN: 16 mg/dL (ref 6–24)
CO2: 24 mmol/L (ref 20–29)
Calcium: 9.7 mg/dL (ref 8.7–10.2)
Chloride: 103 mmol/L (ref 96–106)
Creatinine, Ser: 0.77 mg/dL (ref 0.76–1.27)
Glucose: 96 mg/dL (ref 70–99)
Potassium: 4.8 mmol/L (ref 3.5–5.2)
Sodium: 139 mmol/L (ref 134–144)
eGFR: 106 mL/min/{1.73_m2} (ref >59)

## 2022-03-30 ENCOUNTER — Emergency Department (HOSPITAL_COMMUNITY): Payer: No Typology Code available for payment source

## 2022-03-30 ENCOUNTER — Other Ambulatory Visit: Payer: Self-pay

## 2022-03-30 ENCOUNTER — Emergency Department (HOSPITAL_COMMUNITY)
Admission: EM | Admit: 2022-03-30 | Discharge: 2022-03-30 | Disposition: A | Discharge status: Home / Self Care | Payer: No Typology Code available for payment source | Attending: Emergency Medicine | Admitting: Emergency Medicine

## 2022-03-30 ENCOUNTER — Encounter (HOSPITAL_COMMUNITY): Payer: Self-pay | Admitting: Emergency Medicine

## 2022-03-30 DIAGNOSIS — Y92410 Unspecified street and highway as the place of occurrence of the external cause: Secondary | ICD-10-CM | POA: Insufficient documentation

## 2022-03-30 DIAGNOSIS — S299XXA Unspecified injury of thorax, initial encounter: Secondary | ICD-10-CM | POA: Diagnosis not present

## 2022-03-30 DIAGNOSIS — M545 Low back pain, unspecified: Secondary | ICD-10-CM

## 2022-03-30 DIAGNOSIS — S3992XA Unspecified injury of lower back, initial encounter: Secondary | ICD-10-CM | POA: Diagnosis present
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
## 2022-03-30 NOTE — Discharge Instructions (Addendum)
Return for any problem.  ? ?Use tylenol as needed for pain.  ? ?X-rays performed today are normal and are without signs of broken bones or other significant findings.  ? ? ? ? ? ? ?

## 2022-05-27 ENCOUNTER — Encounter: Payer: Self-pay | Admitting: Emergency Medicine

## 2022-05-27 ENCOUNTER — Ambulatory Visit (INDEPENDENT_AMBULATORY_CARE_PROVIDER_SITE_OTHER): Payer: Commercial Managed Care - HMO | Admitting: Emergency Medicine

## 2022-05-27 VITALS — BP 130/80 | HR 52 | Temp 98.6°F | Ht 66.0 in | Wt 165.0 lb

## 2022-05-27 DIAGNOSIS — Z0000 Encounter for general adult medical examination without abnormal findings: Secondary | ICD-10-CM

## 2022-05-27 DIAGNOSIS — Z130 Encounter for screening for diseases of the blood and blood-forming organs and certain disorders involving the immune mechanism: Secondary | ICD-10-CM | POA: Diagnosis not present

## 2022-05-27 DIAGNOSIS — Z13220 Encounter for screening for lipoid disorders: Secondary | ICD-10-CM

## 2022-05-27 DIAGNOSIS — Z1211 Encounter for screening for malignant neoplasm of colon: Secondary | ICD-10-CM | POA: Diagnosis not present

## 2022-05-27 DIAGNOSIS — Z13228 Encounter for screening for other metabolic disorders: Secondary | ICD-10-CM | POA: Diagnosis not present

## 2022-05-27 DIAGNOSIS — Z7689 Persons encountering health services in other specified circumstances: Secondary | ICD-10-CM

## 2022-05-27 DIAGNOSIS — Z1329 Encounter for screening for other suspected endocrine disorder: Secondary | ICD-10-CM

## 2022-05-27 LAB — COMPREHENSIVE METABOLIC PANEL
ALT: 28 U/L (ref 0–53)
AST: 27 U/L (ref 0–37)
Albumin: 4.5 g/dL (ref 3.5–5.2)
Alkaline Phosphatase: 68 U/L (ref 39–117)
BUN: 14 mg/dL (ref 6–23)
CO2: 27 mEq/L (ref 19–32)
Calcium: 9.8 mg/dL (ref 8.4–10.5)
Chloride: 103 mEq/L (ref 96–112)
Creatinine, Ser: 0.76 mg/dL (ref 0.40–1.50)
GFR: 101.57 mL/min (ref >60.00)
Glucose, Bld: 92 mg/dL (ref 70–99)
Potassium: 3.9 mEq/L (ref 3.5–5.1)
Sodium: 137 mEq/L (ref 135–145)
Total Bilirubin: 0.7 mg/dL (ref 0.2–1.2)
Total Protein: 7.8 g/dL (ref 6.0–8.3)

## 2022-05-27 LAB — CBC WITH DIFFERENTIAL/PLATELET
Basophils Absolute: 0 10*3/uL (ref 0.0–0.1)
Basophils Relative: 0.4 % (ref 0.0–3.0)
Eosinophils Absolute: 0.2 10*3/uL (ref 0.0–0.7)
Eosinophils Relative: 2.8 % (ref 0.0–5.0)
HCT: 43.5 % (ref 39.0–52.0)
Hemoglobin: 14.6 g/dL (ref 13.0–17.0)
Lymphocytes Relative: 13.5 % (ref 12.0–46.0)
Lymphs Abs: 1.2 10*3/uL (ref 0.7–4.0)
MCHC: 33.6 g/dL (ref 30.0–36.0)
MCV: 88.5 fl (ref 78.0–100.0)
Monocytes Absolute: 0.7 10*3/uL (ref 0.1–1.0)
Monocytes Relative: 7.4 % (ref 3.0–12.0)
Neutro Abs: 6.7 10*3/uL (ref 1.4–7.7)
Neutrophils Relative %: 75.9 % (ref 43.0–77.0)
Platelets: 178 10*3/uL (ref 150.0–400.0)
RBC: 4.92 Mil/uL (ref 4.22–5.81)
RDW: 14.1 % (ref 11.5–15.5)
WBC: 8.9 10*3/uL (ref 4.0–10.5)

## 2022-05-27 LAB — LIPID PANEL
Cholesterol: 186 mg/dL (ref 0–200)
HDL: 48.2 mg/dL (ref >39.00)
LDL Cholesterol: 119 mg/dL — ABNORMAL HIGH (ref 0–99)
NonHDL: 137.48
Total CHOL/HDL Ratio: 4
Triglycerides: 92 mg/dL (ref 0.0–149.0)
VLDL: 18.4 mg/dL (ref 0.0–40.0)

## 2022-05-27 LAB — HEMOGLOBIN A1C: Hgb A1c MFr Bld: 5.8 % (ref 4.6–6.5)

## 2022-05-27 NOTE — Progress Notes (Signed)
Sean Watkins 56 y.o.   Chief Complaint  Patient presents with   New Patient (Initial Visit)    HISTORY OF PRESENT ILLNESS: This is a 56 y.o. male here to establish care with me. Requesting physical. Seen once before by me for routine general examination in 2019. Has some concerns about his blood pressure.  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  Blood pressure readings at home are normal.  HPI   Prior to Admission medications   Medication Sig Start Date End Date Taking? Authorizing Provider  atorvastatin (LIPITOR) 20 MG tablet Take 1 tablet (20 mg total) by mouth daily. 03/06/22  Yes Littie DeedsSun, Richard, MD  Cholecalciferol 25 MCG (1000 UT) capsule Take 1 capsule (1,000 Units total) by mouth daily. Patient not taking: Reported on 05/27/2022 03/06/22   Littie DeedsSun, Richard, MD  polyethylene glycol powder (GLYCOLAX/MIRALAX) 17 GM/SCOOP powder Take 17 g by mouth daily. 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 No pertinent surgical history.  Social History   Socioeconomic History   Marital status: Married    Spouse name: Not on file   Number of children: 2   Years of education: Not on file   Highest education level: Not on file  Occupational History   Not on file  Tobacco Use   Smoking status: Never   Smokeless tobacco: Never  Vaping  Use   Vaping Use: Never used  Substance and Sexual Activity   Alcohol use: No   Drug use: No   Sexual activity: Yes  Other Topics Concern   Not on file  Social History Narrative   Not on file   Social Determinants of Health   Financial Resource Strain: Not on file  Food Insecurity: Not on file  Transportation Needs: Not on file  Physical Activity: Not on file  Stress: Not on file  Social Connections: Not on file  Intimate Partner Violence: Not on file    Family History  Problem Relation Age of Onset   Heart disease Mother    Diabetes Mother    Stroke Father      Review of Systems  Constitutional: Negative.  Negative for chills and fever.  HENT: Negative.  Negative for congestion and sore throat.   Respiratory: Negative.  Negative for cough and shortness of breath.   Cardiovascular: Negative.   Gastrointestinal:  Negative for abdominal pain, diarrhea, nausea and vomiting.  Genitourinary: Negative.   Skin: Negative.  Negative for rash.  Neurological: Negative.  Negative for dizziness and headaches.  All other systems reviewed and are negative.  Today's Vitals   05/27/22 1258 05/27/22 1321  BP: 140/84 130/80  Pulse: (!) 52   Temp: 98.6 F (37 C)   TempSrc: Oral   SpO2: 99%  Weight: 165 lb (74.8 kg)   Height: 5\' 6"  (1.676 m)    Body mass index is 26.63 kg/m.  Physical Exam Vitals reviewed.  Constitutional:      Appearance: Normal appearance.  HENT:     Head: Normocephalic.     Right Ear: Tympanic membrane, ear canal and external ear normal.     Left Ear: Tympanic membrane, ear canal and external ear normal.     Mouth/Throat:     Mouth: Mucous membranes are moist.     Pharynx: Oropharynx is clear.  Eyes:     Extraocular Movements: Extraocular movements intact.     Conjunctiva/sclera: Conjunctivae normal.     Pupils: Pupils are equal, round, and reactive to light.  Cardiovascular:     Rate and Rhythm: Normal rate and regular rhythm.     Pulses:  Normal pulses.     Heart sounds: Normal heart sounds.  Pulmonary:     Effort: Pulmonary effort is normal.     Breath sounds: Normal breath sounds.  Abdominal:     General: Bowel sounds are normal. There is no distension.     Palpations: Abdomen is soft.     Tenderness: There is no abdominal tenderness.  Musculoskeletal:        General: Normal range of motion.     Cervical back: No tenderness.     Right lower leg: No edema.     Left lower leg: No edema.  Lymphadenopathy:     Cervical: No cervical adenopathy.  Skin:    General: Skin is warm and dry.     Capillary Refill: Capillary refill takes less than 2 seconds.  Neurological:     General: No focal deficit present.     Mental Status: He is alert and oriented to person, place, and time.  Psychiatric:        Mood and Affect: Mood normal.        Behavior: Behavior normal.     ASSESSMENT & PLAN: Problem List Items Addressed This Visit   None Visit Diagnoses     Routine general medical examination at a health care facility    -  Primary   Encounter to establish care       Colon cancer screening       Relevant Orders   Cologuard   Screening for deficiency anemia       Relevant Orders   CBC with Differential   Screening for lipoid disorders       Relevant Orders   Lipid panel   Screening for endocrine, metabolic and immunity disorder       Relevant Orders   Comprehensive metabolic panel   Hemoglobin A1c      Modifiable risk factors discussed with patient. Anticipatory guidance according to age provided. The following topics were also discussed: Social Determinants of Health Smoking.  Non-smoker Diet and nutrition.  Good nutrition habits Benefits of exercise Cancer screening and need for colon cancer screening. Vaccinations recommendations Cardiovascular risk assessment and need for blood work Mental health including depression and anxiety Fall and accident prevention  Patient Instructions  Mantenimiento de en los hombres Health Maintenance, Male Adoptar un estilo de vida saludable y recibir atencin preventiva son importantes para promover la salud y Dietitian. Consulte al mdico sobre: El esquema adecuado para hacerse pruebas y exmenes peridicos. Cosas que puede hacer por su cuenta para prevenir enfermedades y [place] sano. Qu debo saber sobre la dieta, el peso y el ejercicio? Consuma  una dieta saludable  Consuma una dieta que incluya muchas verduras, frutas, productos lcteos con bajo contenido de [place] y Associate Professor. No consuma muchos alimentos ricos en grasas slidas, azcares agregados o sodio. Mantenga un peso saludable El ndice de masa muscular [facility]) es una medida que puede utilizarse para identificar posibles problemas de [place]. Proporciona una estimacin de la grasa corporal basndose en el peso y la altura. Su mdico puede ayudarle a Engineer, site [facility] y a Personnel officer o Pharmacologist un peso saludable. Haga ejercicio con regularidad Haga ejercicio con regularidad. Esta es una de las prcticas ms importantes que puede hacer por su salud. La [employer] de los adultos deben seguir estas pautas: Education officer, environmental, al menos, 150 minutos de actividad fsica por semana. El ejercicio debe aumentar la frecuencia cardaca y Media planner transpirar (ejercicio de intensidad moderada). Hacer ejercicios de fortalecimiento por lo [employer] por semana. Agregue esto a su plan de ejercicio de intensidad moderada. Pase menos tiempo sentado. Incluso la actividad fsica ligera puede ser beneficiosa. Controle sus niveles de colesterol y lpidos en la sangre Comience a realizarse anlisis de lpidos y Oncologist en la sangre a los 20 aos y luego reptalos cada 5 aos. Es posible que Insurance underwriter los niveles de colesterol con mayor frecuencia si: Sus niveles de lpidos y colesterol son altos. Es mayor de 40 aos. Presenta un alto riesgo de padecer enfermedades cardacas. Qu debo saber sobre las  pruebas de deteccin del cncer? Muchos tipos de cncer pueden detectarse de manera temprana y, a menudo, pueden prevenirse. Segn su historia clnica y sus antecedentes familiares, es posible que deba realizarse pruebas de deteccin del cncer en diferentes edades. Esto puede incluir pruebas de deteccin de lo siguiente: Building services engineer. Cncer de prstata. Cncer de piel. Cncer de pulmn. Qu debo saber sobre la enfermedad cardaca, la diabetes y la hipertensin arterial? Presin arterial y enfermedad cardaca La hipertensin arterial causa enfermedades cardacas y [place] el riesgo de accidente cerebrovascular. Es ms probable que esto se manifieste en las personas que tienen lecturas de presin arterial alta o tienen sobrepeso. Hable con el mdico sobre sus valores de presin arterial deseados. Hgase controlar la presin arterial: Cada 3 a 5 aos si tiene entre 18 y 19 aos. Todos los aos si es mayor de 40 aos. Si tiene entre 65 y 55 aos y es fumador o Insurance underwriter, pregntele al mdico si debe realizarse una prueba de deteccin de aneurisma artico abdominal (AAA) por nica vez. Diabetes Realcese exmenes de deteccin de la diabetes con regularidad. Este anlisis revisa el nivel de azcar en la sangre en [place]. Hgase las pruebas de deteccin: Cada tres aos despus de los 45 aos de edad si tiene un peso normal y un bajo riesgo de padecer diabetes. Con ms frecuencia y a partir de [place] edad inferior si tiene sobrepeso o un alto riesgo de padecer diabetes. Qu debo saber sobre la prevencin de infecciones? Hepatitis B Si tiene un riesgo ms alto de contraer hepatitis B, debe someterse a un examen de deteccin de este virus. Hable con el mdico para averiguar si tiene riesgo de contraer la infeccin por hepatitis B. Hepatitis C Se recomienda un anlisis de [place] para: Todos los que nacieron entre 1945 y [id number]. Todas las personas que tengan un riesgo de haber contrado hepatitis  C. Enfermedades de transmisin sexual (ETS) Debe realizarse pruebas de deteccin de ITS todos los aos, incluidas la gonorrea y la clamidia, si: Es sexualmente activo y es menor de [address].  Es mayor de [address], y Public affairs consultant informa que corre riesgo de tener este tipo de infecciones. La actividad sexual ha cambiado desde que le hicieron la ltima prueba de deteccin y tiene un riesgo mayor de Warehouse manager clamidia o Copy. Pregntele al mdico si usted tiene riesgo. Pregntele al mdico si usted tiene un alto riesgo de Primary school teacher VIH. El mdico tambin puede recomendarle un medicamento recetado para ayudar a evitar la infeccin por el VIH. Si elige tomar medicamentos para prevenir el VIH, primero debe [employer] de deteccin del VIH. Luego debe hacerse anlisis cada 3 meses mientras est tomando los medicamentos. Siga estas indicaciones en su casa: Consumo de alcohol No beba alcohol si el mdico se lo prohbe. Si bebe alcohol: Limite la cantidad que consume de 0 a 2 bebidas por da. Sepa cunta cantidad de alcohol hay en las bebidas que toma. En los [address], una medida equivale a una botella de cerveza de 12 oz (355 ml), un vaso de vino de 5 oz (148 ml) o un vaso de una bebida alcohlica de alta graduacin de 1 oz (44 ml). Estilo de vida No consuma ningn producto que contenga nicotina o tabaco. Estos productos incluyen cigarrillos, tabaco para Theatre manager y aparatos de vapeo, como los Administrator, Civil Service. Si necesita ayuda para dejar de consumir estos productos, consulte al mdico. No consuma drogas. No comparta agujas. Solicite ayuda a su mdico si necesita apoyo o informacin para abandonar las drogas. Indicaciones generales Realcese los estudios de rutina de [address], dentales y de Wellsite geologist. Mantngase al da con las vacunas. Infrmele a su mdico si: Se siente deprimido con frecuencia. Alguna vez ha sido vctima de [place] o no se siente seguro en su casa. Resumen Adoptar  un estilo de vida saludable y recibir atencin preventiva son importantes para promover la salud y Counsellor. Siga las instrucciones del mdico acerca de una dieta saludable, el ejercicio y la realizacin de pruebas o exmenes para Hotel manager. Siga las instrucciones del mdico con respecto al control del colesterol y la presin arterial. Esta informacin no tiene Theme park manager el consejo del mdico. Asegrese de hacerle al mdico cualquier pregunta que tenga. Document Revised: 05/16/2021 Document Reviewed: 05/16/2021 Elsevier Patient Education  2023 Elsevier Inc.     Edwina Barth, MD [facility]

## 2022-05-27 NOTE — Patient Instructions (Signed)
Mantenimiento de la salud en los hombres Health Maintenance, Male Adoptar un estilo de vida saludable y recibir atencin preventiva son importantes para promover la salud y el bienestar. Consulte al mdico sobre: El esquema adecuado para hacerse pruebas y exmenes peridicos. Cosas que puede hacer por su cuenta para prevenir enfermedades y mantenerse sano. Qu debo saber sobre la dieta, el peso y el ejercicio? Consuma una dieta saludable  Consuma una dieta que incluya muchas verduras, frutas, productos lcteos con bajo contenido de grasa y protenas magras. No consuma muchos alimentos ricos en grasas slidas, azcares agregados o sodio. Mantenga un peso saludable El ndice de masa muscular (IMC) es una medida que puede utilizarse para identificar posibles problemas de peso. Proporciona una estimacin de la grasa corporal basndose en el peso y la altura. Su mdico puede ayudarle a determinar su IMC y a lograr o mantener un peso saludable. Haga ejercicio con regularidad Haga ejercicio con regularidad. Esta es una de las prcticas ms importantes que puede hacer por su salud. La mayora de los adultos deben seguir estas pautas: Realizar, al menos, 150 minutos de actividad fsica por semana. El ejercicio debe aumentar la frecuencia cardaca y hacerlo transpirar (ejercicio de intensidad moderada). Hacer ejercicios de fortalecimiento por lo menos dos veces por semana. Agregue esto a su plan de ejercicio de intensidad moderada. Pase menos tiempo sentado. Incluso la actividad fsica ligera puede ser beneficiosa. Controle sus niveles de colesterol y lpidos en la sangre Comience a realizarse anlisis de lpidos y colesterol en la sangre a los 20 aos y luego reptalos cada 5 aos. Es posible que necesite controlar los niveles de colesterol con mayor frecuencia si: Sus niveles de lpidos y colesterol son altos. Es mayor de 40 aos. Presenta un alto riesgo de padecer enfermedades cardacas. Qu debo  saber sobre las pruebas de deteccin del cncer? Muchos tipos de cncer pueden detectarse de manera temprana y, a menudo, pueden prevenirse. Segn su historia clnica y sus antecedentes familiares, es posible que deba realizarse pruebas de deteccin del cncer en diferentes edades. Esto puede incluir pruebas de deteccin de lo siguiente: Cncer colorrectal. Cncer de prstata. Cncer de piel. Cncer de pulmn. Qu debo saber sobre la enfermedad cardaca, la diabetes y la hipertensin arterial? Presin arterial y enfermedad cardaca La hipertensin arterial causa enfermedades cardacas y aumenta el riesgo de accidente cerebrovascular. Es ms probable que esto se manifieste en las personas que tienen lecturas de presin arterial alta o tienen sobrepeso. Hable con el mdico sobre sus valores de presin arterial deseados. Hgase controlar la presin arterial: Cada 3 a 5 aos si tiene entre 18 y 39 aos. Todos los aos si es mayor de 40 aos. Si tiene entre 65 y 75 aos y es fumador o sola fumar, pregntele al mdico si debe realizarse una prueba de deteccin de aneurisma artico abdominal (AAA) por nica vez. Diabetes Realcese exmenes de deteccin de la diabetes con regularidad. Este anlisis revisa el nivel de azcar en la sangre en ayunas. Hgase las pruebas de deteccin: Cada tres aos despus de los 45 aos de edad si tiene un peso normal y un bajo riesgo de padecer diabetes. Con ms frecuencia y a partir de una edad inferior si tiene sobrepeso o un alto riesgo de padecer diabetes. Qu debo saber sobre la prevencin de infecciones? Hepatitis B Si tiene un riesgo ms alto de contraer hepatitis B, debe someterse a un examen de deteccin de este virus. Hable con el mdico para averiguar si tiene riesgo de   contraer la infeccin por hepatitis B. Hepatitis C Se recomienda un anlisis de sangre para: Todos los que nacieron entre 1945 y 1965. Todas las personas que tengan un riesgo de haber  contrado hepatitis C. Enfermedades de transmisin sexual (ETS) Debe realizarse pruebas de deteccin de ITS todos los aos, incluidas la gonorrea y la clamidia, si: Es sexualmente activo y es menor de 24 aos. Es mayor de 24 aos, y el mdico le informa que corre riesgo de tener este tipo de infecciones. La actividad sexual ha cambiado desde que le hicieron la ltima prueba de deteccin y tiene un riesgo mayor de tener clamidia o gonorrea. Pregntele al mdico si usted tiene riesgo. Pregntele al mdico si usted tiene un alto riesgo de contraer VIH. El mdico tambin puede recomendarle un medicamento recetado para ayudar a evitar la infeccin por el VIH. Si elige tomar medicamentos para prevenir el VIH, primero debe hacerse los anlisis de deteccin del VIH. Luego debe hacerse anlisis cada 3 meses mientras est tomando los medicamentos. Siga estas indicaciones en su casa: Consumo de alcohol No beba alcohol si el mdico se lo prohbe. Si bebe alcohol: Limite la cantidad que consume de 0 a 2 bebidas por da. Sepa cunta cantidad de alcohol hay en las bebidas que toma. En los [place], una medida equivale a una botella de cerveza de 12 oz (355 ml), un vaso de vino de 5 oz (148 ml) o un vaso de una bebida alcohlica de alta graduacin de 1 oz (44 ml). Estilo de vida No consuma ningn producto que contenga nicotina o tabaco. Estos productos incluyen cigarrillos, tabaco para mascar y aparatos de vapeo, como los cigarrillos electrnicos. Si necesita ayuda para dejar de consumir estos productos, consulte al mdico. No consuma drogas. No comparta agujas. Solicite ayuda a su mdico si necesita apoyo o informacin para abandonar las drogas. Indicaciones generales Realcese los estudios de rutina de la salud, dentales y de la vista. Mantngase al da con las vacunas. Infrmele a su mdico si: Se siente deprimido con frecuencia. Alguna vez ha sido vctima de maltrato o no se siente seguro en su  casa. Resumen Adoptar un estilo de vida saludable y recibir atencin preventiva son importantes para promover la salud y el bienestar. Siga las instrucciones del mdico acerca de una dieta saludable, el ejercicio y la realizacin de pruebas o exmenes para detectar enfermedades. Siga las instrucciones del mdico con respecto al control del colesterol y la presin arterial. Esta informacin no tiene como fin reemplazar el consejo del mdico. Asegrese de hacerle al mdico cualquier pregunta que tenga. Document Revised: 05/16/2021 Document Reviewed: 05/16/2021 Elsevier Patient Education  2023 Elsevier Inc.  

## 2022-06-18 LAB — COLOGUARD: COLOGUARD: NEGATIVE

## 2022-08-22 ENCOUNTER — Encounter: Payer: Self-pay | Admitting: Emergency Medicine

## 2022-08-22 ENCOUNTER — Ambulatory Visit: Payer: Commercial Managed Care - HMO | Admitting: Emergency Medicine

## 2022-08-22 VITALS — BP 130/80 | HR 100 | Temp 98.2°F | Ht 66.0 in | Wt 165.4 lb

## 2022-08-22 DIAGNOSIS — G500 Trigeminal neuralgia: Secondary | ICD-10-CM | POA: Diagnosis not present

## 2022-08-22 LAB — CBC WITH DIFFERENTIAL/PLATELET
Basophils Absolute: 0 10*3/uL (ref 0.0–0.1)
Basophils Relative: 0.6 % (ref 0.0–3.0)
Eosinophils Absolute: 0.2 10*3/uL (ref 0.0–0.7)
Eosinophils Relative: 3.7 % (ref 0.0–5.0)
HCT: 41.7 % (ref 39.0–52.0)
Hemoglobin: 14.1 g/dL (ref 13.0–17.0)
Lymphocytes Relative: 34.8 % (ref 12.0–46.0)
Lymphs Abs: 1.9 10*3/uL (ref 0.7–4.0)
MCHC: 33.7 g/dL (ref 30.0–36.0)
MCV: 88.7 fl (ref 78.0–100.0)
Monocytes Absolute: 0.3 10*3/uL (ref 0.1–1.0)
Monocytes Relative: 6.2 % (ref 3.0–12.0)
Neutro Abs: 3 10*3/uL (ref 1.4–7.7)
Neutrophils Relative %: 54.7 % (ref 43.0–77.0)
Platelets: 179 10*3/uL (ref 150.0–400.0)
RBC: 4.71 Mil/uL (ref 4.22–5.81)
RDW: 14.3 % (ref 11.5–15.5)
WBC: 5.5 10*3/uL (ref 4.0–10.5)

## 2022-08-22 LAB — COMPREHENSIVE METABOLIC PANEL
ALT: 24 U/L (ref 0–53)
AST: 24 U/L (ref 0–37)
Albumin: 4.4 g/dL (ref 3.5–5.2)
Alkaline Phosphatase: 55 U/L (ref 39–117)
BUN: 16 mg/dL (ref 6–23)
CO2: 27 mEq/L (ref 19–32)
Calcium: 9.3 mg/dL (ref 8.4–10.5)
Chloride: 105 mEq/L (ref 96–112)
Creatinine, Ser: 0.81 mg/dL (ref 0.40–1.50)
GFR: 99.47 mL/min (ref >60.00)
Glucose, Bld: 101 mg/dL — ABNORMAL HIGH (ref 70–99)
Potassium: 4.6 mEq/L (ref 3.5–5.1)
Sodium: 139 mEq/L (ref 135–145)
Total Bilirubin: 0.6 mg/dL (ref 0.2–1.2)
Total Protein: 7.3 g/dL (ref 6.0–8.3)

## 2022-08-22 LAB — TSH: TSH: 2 u[IU]/mL (ref 0.35–5.50)

## 2022-08-22 LAB — VITAMIN B12: Vitamin B-12: 256 pg/mL (ref 211–911)

## 2022-08-22 LAB — HIV ANTIBODY (ROUTINE TESTING W REFLEX): HIV 1&2 Ab, 4th Generation: NONREACTIVE

## 2022-08-22 NOTE — Progress Notes (Signed)
Sean Watkins 56 y.o.   Chief Complaint  Patient presents with   left face and ear pain    HISTORY OF PRESENT ILLNESS: This is a 56 y.o. male complaining of electrical shock like pain to left side of the face lasting 2 to 3 minutes that started 4 days ago.  Unknown trigger.  No known injuries.  No other associated symptoms. Several times a day.  Pain seems to originate from left ear area. No other complaints or medical concerns No change in lifestyle.  No new medications.  No flulike symptoms.  No fever or chills.  HPI   Prior to Admission medications   Medication Sig Start Date End Date Taking? Authorizing Provider  atorvastatin (LIPITOR) 20 MG tablet Take 1 tablet (20 mg total) by mouth daily. 03/06/22   Littie Deeds, MD  Cholecalciferol 25 MCG (1000 UT) capsule Take 1 capsule (1,000 Units total) by mouth daily. Patient not taking: Reported on 05/27/2022 03/06/22   Littie Deeds, MD  polyethylene glycol powder (GLYCOLAX/MIRALAX) 17 GM/SCOOP powder Take 17 g by mouth daily. 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  Social History   Socioeconomic History   Marital status: Married    Spouse name: Not on file   Number of children: 2   Years of education: Not on file   Highest education level: Not on file  Occupational History   Not on file  Tobacco Use   Smoking status: Never   Smokeless tobacco: Never  Vaping  Use   Vaping Use: Never used  Substance and Sexual Activity   Alcohol use: No   Drug use: No   Sexual activity: Yes  Other Topics Concern   Not on file  Social History Narrative   Not on file   Social Determinants of Health   Financial Resource Strain: Not on file  Food Insecurity: Not on file  Transportation Needs: Not on file  Physical Activity: Not on file  Stress: Not on file  Social Connections: Not on file  Intimate Partner Violence: Not on file    Family History  Problem Relation Age of Onset   Heart disease Mother    Diabetes Mother    Stroke Father      Review of Systems  Constitutional: Negative.  Negative for chills and fever.  HENT: Negative.  Negative for congestion and sore throat.   Respiratory: Negative.  Negative for cough and shortness of breath.   Cardiovascular: Negative.  Negative for chest pain and palpitations.  Gastrointestinal: Negative.  Negative for abdominal pain, nausea and vomiting.  Genitourinary: Negative.   Skin: Negative.  Negative for rash.  Neurological:  Positive for headaches. Negative for dizziness.  All other systems reviewed and are negative.   Today's Vitals   08/22/22 0903  BP: 130/80  Pulse: 100  Temp: 98.2 F (36.8 C)  TempSrc: Oral  SpO2: 97%  Weight: 165 lb 6 oz (75 kg)  Height: 5\' 6"  (1.676 m)   Body mass index is 26.69 kg/m.  Physical Exam Vitals reviewed.  Constitutional:      Appearance: Normal appearance.  HENT:     Head: Normocephalic.     Right Ear: Tympanic membrane, ear canal and external ear normal.     Left Ear: Tympanic membrane, ear canal and external ear normal.     Mouth/Throat:     Mouth: Mucous membranes are moist.     Pharynx: Oropharynx is clear.  Eyes:     Extraocular Movements: Extraocular movements intact.     Conjunctiva/sclera: Conjunctivae normal.     Pupils: Pupils are equal, round, and reactive to light.  Neck:     Vascular: No carotid bruit.  Cardiovascular:     Rate  and Rhythm: Normal rate and regular rhythm.     Pulses: Normal pulses.     Heart sounds: Normal heart sounds.  Pulmonary:     Effort: Pulmonary effort is normal.     Breath sounds: Normal breath sounds.  Abdominal:     Palpations: Abdomen is soft.     Tenderness: There is no abdominal tenderness.  Musculoskeletal:     Cervical back: No tenderness.  Lymphadenopathy:     Cervical: No cervical adenopathy.  Skin:    General: Skin is warm and dry.     Capillary Refill: Capillary refill takes less than 2 seconds.  Neurological:     General: No focal deficit present.     Mental Status: He is alert and oriented to person, place, and time.     Cranial Nerves: No cranial nerve deficit.     Sensory: No sensory deficit.     Motor: No weakness.     Coordination: Coordination normal.     Gait: Gait normal.  Psychiatric:        Mood and Affect: Mood normal.        Behavior: Behavior normal.      ASSESSMENT & PLAN: A total of 47 minutes was spent with the patient and counseling/coordination of care regarding preparing for this visit, review of most recent office visit notes, review of most recent blood work results, clinical diagnosis of trigeminal neuralgia and need for work-up including MRI of brain and neurology evaluation, prognosis, documentation, need for follow-up.  Problem List Items Addressed This Visit       Nervous and Auditory   Trigeminal neuralgia of left side of face - Primary    Clinical presentation very suggestive of trigeminal neuralgia. No abnormal physical findings. Unknown trigger.  Blood work done today. Of recent onset.  Needs neurology evaluation We will also need brain MRI. Neurology referral placed today.       Relevant Orders   Ambulatory referral to Neurology   CBC with Differential/Platelet (Completed)   Comprehensive metabolic panel (Completed)   Vitamin B12 (Completed)   HIV Antibody (routine testing w rflx)   TSH (Completed)   MR Brain Wo  Contrast   VITAMIN D 25 Hydroxy (Vit-D Deficiency, Fractures)   Patient Instructions  Neuralgia del trigmino Trigeminal Neuralgia  La neuralgia del trigmino es un trastorno nervioso que causa dolor intenso en un lado de la cara. El dolor puede durar entre unos segundos y varios minutos, pero puede ocurrir cientos de . Habitualmente, el dolor aparece en un lado de la cara. Los sntomas  pueden presentarse [address], [address] o meses, y Clinical biochemist por meses o aos. El dolor puede volver y ser peor que antes. Cules son las causas? Esta afeccin puede ser causada por lo siguiente: [place] Ramus o presin en un nervio de la cabeza que se llama nervio trigmino. Las siguientes acciones pueden desencadenar una crisis: Systems developer. Maquillarse. Lavarse, afeitarse o tocarse la cara. Cepillarse los dientes. Las rfagas de aire fro o caliente. Trastornos desmielinizantes primarios, como la esclerosis mltiple. Tumores. Qu incrementa el riesgo? Es ms probable que sufra esta afeccin si: Tiene entre 50 y 62 aos de edad. Es mujer. Cules son los signos o sntomas? El sntoma principal de esta afeccin es un dolor intenso en la [place], los labios, los ojos, la [place], el cuero cabelludo, la frente y Recruitment consultant. Cmo se diagnostica? Esta afeccin se diagnostica mediante un examen fsico. Se puede realizar una exploracin por tomografa computarizada (TC) o una resonancia magntica (RM) para descartar la presencia de otros trastornos que pueden causar dolor facial. Cmo se trata? El tratamiento de esta afeccin puede incluir: Medidas para evitar las cosas que desencadenan los sntomas. Medicamentos recetados, [employer]. Procedimientos como ablacin, tratamiento trmico o radioterapia. Terapia cognitiva o conductual. Tratamientos complementarios, tales como: Ejercicio suave y regular o yoga. Meditacin. Aromaterapia. Acupuntura. Ciruga. Esta puede  realizarse [employer] casos son graves, si otro tratamiento mdico no brinda alivio. El tratamiento puede demorar hasta un mes en comenzar a Engineer, materials. Siga estas indicaciones en su casa: Control del dolor  Infrmese lo ms que pueda sobre cmo Runner, broadcasting/film/video. Pregntele al mdico si sera til recurrir a Dance movement psychotherapist. Considere hablar con un mdico especializado en salud mental sobre cmo lidiar con Chief Technology Officer. Considere la posibilidad de Advertising account planner en un grupo de apoyo para Chief Technology Officer. Indicaciones generales Use los medicamentos de venta libre y los recetados solamente como se lo haya indicado el mdico. Evite las cosas que desencadenan los sntomas. Puede ser de ayuda: Masticar del lado de la boca que no est afectado. Evitar tocarse el rostro. Evitar las rfagas de aire fro o caliente. Concurra a todas las visitas de seguimiento. Dnde buscar ms informacin Facial Pain Association (Asociacin del dolor facial): facepain.org Comunquese con un mdico si: Los medicamentos que toma no [employer]. Los medicamentos que toma para el tratamiento le causan efectos secundarios. Le aparecen sntomas nuevos que no se pueden explicar, por ejemplo: Visin doble. Debilidad o adormecimiento facial. Cambios en la audicin o el equilibrio. Se siente deprimida. Solicite ayuda de inmediato si: El dolor es intenso y no mejora. Tiene pensamientos suicidas. Si alguna vez siente que puede lastimarse o Physicist, medical a Economist, o tiene pensamientos de poner fin a su vida, busque ayuda de inmediato. Dirjase al servicio de urgencias ms cercano o: Comunquese con el servicio de emergencias de su localidad (911 en los [place]). Llame a una lnea de [place] para crisis suicidas, como la National Suicide Prevention Lifeline (Lnea Telefnica Nacional para la Prevencin del Suicidio) al [phone] o al 988 en los [place]. Esta lnea atiende las 24 horas del da en los [place] Enve un mensaje de texto a la lnea para casos de crisis al [phone] (en los [place] Resumen La neuralgia del trigmino es un trastorno nervioso que causa dolor intenso en un lado de la cara. El dolor puede durar de unos segundos a varios minutos. La causa de esta afeccin es el dao o la  presin en un nervio de la cabeza que se llama trigmino. El tratamiento puede incluir evitar las cosas que desencadenan los sntomas, tomar medicamentos, o someterse a [place] o procedimientos. El tratamiento puede demorar hasta un mes en comenzar a Engineer, materials. Concurra a todas las visitas de seguimiento. Esta informacin no tiene Theme park manager el consejo del mdico. Asegrese de hacerle al mdico cualquier pregunta que tenga. Document Revised: 06/28/2021 Document Reviewed: 06/28/2021 Elsevier Patient Education  2023 Elsevier Inc.    Edwina Barth, MD [facility]

## 2022-08-22 NOTE — Assessment & Plan Note (Signed)
Clinical presentation very suggestive of trigeminal neuralgia. No abnormal physical findings. Unknown trigger.  Blood work done today. Of recent onset.  Needs neurology evaluation We will also need brain MRI. Neurology referral placed today.

## 2022-08-22 NOTE — Patient Instructions (Signed)
Neuralgia del trigmino Trigeminal Neuralgia  La neuralgia del trigmino es un trastorno nervioso que causa dolor intenso en un lado de la cara. El dolor puede durar entre unos segundos y varios minutos, pero puede ocurrir cientos de Immunologist. Habitualmente, el dolor aparece en un lado de la cara. Los sntomas pueden presentarse [address], semanas o meses, y Clinical biochemist por meses o aos. El dolor puede volver y ser peor que antes. Cules son las causas? Esta afeccin puede ser causada por lo siguiente: Gard[place] Ramus o presin en un nervio de la cabeza que se llama nervio trigmino. Las siguientes acciones pueden desencadenar una crisis: Systems developer. Maquillarse. Lavarse, afeitarse o tocarse la cara. Cepillarse los dientes. Las rfagas de aire fro o caliente. Trastornos desmielinizantes primarios, como la esclerosis mltiple. Tumores. Qu incrementa el riesgo? Es ms probable que sufra esta afeccin si: Tiene entre 50 y 4 aos de edad. Es mujer. Cules son los signos o sntomas? El sntoma principal de esta afeccin es un dolor intenso en la [place], los labios, los ojos, la [place], el cuero cabelludo, la frente y Recruitment consultant. Cmo se diagnostica? Esta afeccin se diagnostica mediante un examen fsico. Se puede realizar una exploracin por tomografa computarizada (TC) o una resonancia magntica (RM) para descartar la presencia de otros trastornos que pueden causar dolor facial. Cmo se trata? El tratamiento de esta afeccin puede incluir: Medidas para evitar las cosas que desencadenan los sntomas. Medicamentos recetados, [employer]. Procedimientos como ablacin, tratamiento trmico o radioterapia. Terapia cognitiva o conductual. Tratamientos complementarios, tales como: Ejercicio suave y regular o yoga. Meditacin. Aromaterapia. Acupuntura. Ciruga. Esta puede realizarse [employer] casos son graves, si otro tratamiento mdico no brinda alivio. El  tratamiento puede demorar hasta un mes en comenzar a Engineer, materials. Siga estas indicaciones en su casa: Control del dolor  Infrmese lo ms que pueda sobre cmo Runner, broadcasting/film/video. Pregntele al mdico si sera til recurrir a Dance movement psychotherapist. Considere hablar con un mdico especializado en salud mental sobre cmo lidiar con Chief Technology Officer. Considere la posibilidad de Advertising account planner en un grupo de apoyo para Chief Technology Officer. Indicaciones generales Use los medicamentos de venta libre y los recetados solamente como se lo haya indicado el mdico. Evite las cosas que desencadenan los sntomas. Puede ser de ayuda: Masticar del lado de la boca que no est afectado. Evitar tocarse el rostro. Evitar las rfagas de aire fro o caliente. Concurra a todas las visitas de seguimiento. Dnde buscar ms informacin Facial Pain Association (Asociacin del dolor facial): facepain.org Comunquese con un mdico si: Los medicamentos que toma no [employer]. Los medicamentos que toma para el tratamiento le causan efectos secundarios. Le aparecen sntomas nuevos que no se pueden explicar, por ejemplo: Visin doble. Debilidad o adormecimiento facial. Cambios en la audicin o el equilibrio. Se siente deprimida. Solicite ayuda de inmediato si: El dolor es intenso y no mejora. Tiene pensamientos suicidas. Si alguna vez siente que puede lastimarse o Physicist, medical a Economist, o tiene pensamientos de poner fin a su vida, busque ayuda de inmediato. Dirjase al servicio de urgencias ms cercano o: Comunquese con el servicio de emergencias de su localidad (911 en los [place]). Llame a una lnea de [place] para crisis suicidas, como la National Suicide Prevention Lifeline (Lnea Telefnica Nacional para la Prevencin del Suicidio) al [phone] o al 988 en los [place]. Esta lnea atiende las 24 horas del da en los [place] Enve un mensaje de  texto a la lnea para casos de crisis al [phone] (en los [place] Resumen La neuralgia del trigmino es un trastorno nervioso que causa dolor intenso en un lado de la cara. El dolor puede durar de unos segundos a varios minutos. La causa de esta afeccin es el dao o la presin en un nervio de la cabeza que se llama trigmino. El tratamiento puede incluir evitar las cosas que desencadenan los sntomas, tomar medicamentos, o someterse a [place] o procedimientos. El tratamiento puede demorar hasta un mes en comenzar a Engineer, materials. Concurra a todas las visitas de seguimiento. Esta informacin no tiene Theme park manager el consejo del mdico. Asegrese de hacerle al mdico cualquier pregunta que tenga. Document Revised: 06/28/2021 Document Reviewed: 06/28/2021 Elsevier Patient Education  2023 [employer].

## 2022-08-27 ENCOUNTER — Encounter: Payer: Self-pay | Admitting: Neurology
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
## 2022-09-19 ENCOUNTER — Ambulatory Visit: Payer: Commercial Managed Care - HMO | Admitting: Neurology

## 2022-09-19 ENCOUNTER — Encounter: Payer: Self-pay | Admitting: Neurology

## 2022-09-19 VITALS — BP 148/89 | HR 53 | Ht 66.0 in | Wt 166.0 lb

## 2022-09-19 DIAGNOSIS — H9212 Otorrhea, left ear: Secondary | ICD-10-CM

## 2022-09-19 DIAGNOSIS — R29818 Other symptoms and signs involving the nervous system: Secondary | ICD-10-CM | POA: Diagnosis not present

## 2022-09-19 DIAGNOSIS — M542 Cervicalgia: Secondary | ICD-10-CM

## 2022-09-19 DIAGNOSIS — R519 Headache, unspecified: Secondary | ICD-10-CM

## 2022-09-19 NOTE — Patient Instructions (Addendum)
I saw you today for the pain in your left face and head. I am not sure the exact cause, but want to get an MRI of your brain and of the blood vessels in the brain (MRI brain and MRA brain and carotid).  I will be in touch with next steps after I get this result. If you do not hear from MRI in a couple of weeks, please give [place] a call and we will check on this.  I saw your vitamin D was low. I recommend taking an over the counter vitamin D supplement 1000 IU (international units) daily. This can be bought over the counter at any local drug store or online.  Your B12 was borderline low. Given your symptoms, I would recommend supplementing with B12 1000 mcg daily. This can be bought over the counter at any local drug store or online.   You have some reduced sensation in your left arm region. This could be because you lean on your elbow frequently. I recommend avoiding this if possible.  The physicians and staff at [facility] are committed to providing excellent care. You may receive a survey requesting feedback about your experience at our office. We strive to receive "very good" responses to the survey questions. If you feel that your experience would prevent you from giving the office a "very good " response, please contact our office to try to remedy the situation. We may be reached at [phone]. Thank you for taking the time out of your busy day to complete the survey.  Kai Levins, MD [facility]

## 2022-09-23 ENCOUNTER — Other Ambulatory Visit: Payer: Self-pay | Admitting: *Deleted

## 2022-10-14 ENCOUNTER — Ambulatory Visit
Admission: RE | Admit: 2022-10-14 | Discharge: 2022-10-14 | Disposition: A | Discharge status: Home / Self Care | Payer: Commercial Managed Care - HMO | Source: Ambulatory Visit | Attending: Neurology | Admitting: Neurology

## 2022-10-14 DIAGNOSIS — M542 Cervicalgia: Secondary | ICD-10-CM

## 2022-10-14 DIAGNOSIS — R519 Headache, unspecified: Secondary | ICD-10-CM

## 2022-10-14 DIAGNOSIS — H9212 Otorrhea, left ear: Secondary | ICD-10-CM

## 2022-10-14 MED ORDER — GADOPICLENOL 0.5 MMOL/ML IV SOLN
7.5000 mL | Freq: Once | INTRAVENOUS | Status: AC | PRN
  Administered 2022-10-14: 7.5 mL via INTRAVENOUS

## 2022-10-16 ENCOUNTER — Telehealth: Payer: Self-pay | Admitting: Anesthesiology

## 2022-10-16 NOTE — Telephone Encounter (Signed)
Patient requesting a call back with MRI results from 10/14/22.

## 2022-10-16 NOTE — Telephone Encounter (Signed)
Called patient and informed him that Dr. Berdine Addison is out sick and I wil send his MRI the covering physicians to see if they are able to help. Patient wanted to know "what the delay was" and I informed him that his MRI was just done on the 23 rd and it can take a couple days to receive results as physicians are busy in clinic.

## 2022-10-17 ENCOUNTER — Other Ambulatory Visit: Payer: Self-pay

## 2022-10-17 ENCOUNTER — Telehealth: Payer: Self-pay | Admitting: Neurology

## 2022-10-17 ENCOUNTER — Telehealth: Payer: Self-pay

## 2022-10-17 ENCOUNTER — Encounter: Payer: Self-pay | Admitting: Neurology

## 2022-10-17 DIAGNOSIS — H9202 Otalgia, left ear: Secondary | ICD-10-CM

## 2022-10-17 DIAGNOSIS — H9212 Otorrhea, left ear: Secondary | ICD-10-CM

## 2022-10-17 NOTE — Telephone Encounter (Signed)
Order done

## 2022-10-17 NOTE — Telephone Encounter (Signed)
-----   Message from Shellia Carwin, MD sent at 10/17/2022 10:54 AM EDT ----- Regarding: ENT referral Can you order this gentleman an ENT referral for left ear pain? I already talked to him and he wanted me to refer him.   Thank you,  Kai Levins

## 2022-10-17 NOTE — Telephone Encounter (Signed)
Pt called back in returning Dr. Cathey Endow call

## 2022-10-17 NOTE — Telephone Encounter (Signed)
Attempted to return patient's call, but call went straight to VM. I left a message asking for a call back and indicated I would also send a MyChart message about his MRI results (normal).  Kai Levins, MD [facility]

## 2022-10-17 NOTE — Telephone Encounter (Signed)
Returned patient's call to discuss MRI and MRA results. They were unremarkable.  He has not had further face pain. He continues to have ear pain and feeling of fullness. He continues to be concerned there is an issue in that left ear. He would like to have an option from ENT.  I will refer at patient's request.  All questions were answered.  Sean Levins, MD [facility]

## 2022-10-22 ENCOUNTER — Telehealth: Payer: Self-pay | Admitting: Neurology

## 2022-10-22 NOTE — Telephone Encounter (Signed)
Pt called in and left a message. He said he has information for the ENT referral.

## 2022-10-23 NOTE — Telephone Encounter (Signed)
Called pt and received information.

## 2022-10-24 ENCOUNTER — Other Ambulatory Visit: Payer: Self-pay

## 2022-10-28 ENCOUNTER — Telehealth: Payer: Self-pay

## 2022-10-28 NOTE — Telephone Encounter (Signed)
-----   Message from Jeremy L Hill, MD sent at 10/28/2022  8:54 AM EST ----- Regarding: ENT Thornton sent me this message:  Hello, Dr Jeremy. I have information about referring me to an ENT about my leafy ear. Here is the info: Dr Dwight D Bates phone # [phone] Thank you  Can you try this ENT referral?   Thank you,  Jeremy Hill  

## 2022-10-28 NOTE — Telephone Encounter (Signed)
Sent referral to Dr. Melida Quitter. Today.

## 2022-10-28 NOTE — Telephone Encounter (Signed)
-----   Message from Shellia Carwin, MD sent at 10/28/2022  8:54 AM EST ----- Regarding: ENT Mirko sent me this message:  Hello, Dr Ysidro Evert. I have information about referring me to an ENT about my leafy ear. Here is the info: Dr Onnie Graham phone # [phone] Thank you  Can you try this ENT referral?   Thank you,  [facility]

## 2022-12-31 ENCOUNTER — Encounter: Payer: Self-pay | Admitting: Emergency Medicine

## 2022-12-31 ENCOUNTER — Ambulatory Visit: Payer: Commercial Managed Care - HMO | Admitting: Emergency Medicine

## 2022-12-31 VITALS — BP 120/70 | HR 57 | Temp 98.2°F | Ht 66.0 in | Wt 168.2 lb

## 2022-12-31 DIAGNOSIS — K599 Functional intestinal disorder, unspecified: Secondary | ICD-10-CM | POA: Diagnosis not present

## 2022-12-31 LAB — CBC WITH DIFFERENTIAL/PLATELET
Basophils Absolute: 0 K/uL (ref 0.0–0.1)
Basophils Relative: 0.4 % (ref 0.0–3.0)
Eosinophils Absolute: 0.3 K/uL (ref 0.0–0.7)
Eosinophils Relative: 4.8 % (ref 0.0–5.0)
HCT: 42.1 % (ref 39.0–52.0)
Hemoglobin: 14.4 g/dL (ref 13.0–17.0)
Lymphocytes Relative: 26.1 % (ref 12.0–46.0)
Lymphs Abs: 1.8 K/uL (ref 0.7–4.0)
MCHC: 34.3 g/dL (ref 30.0–36.0)
MCV: 86.4 fl (ref 78.0–100.0)
Monocytes Absolute: 0.6 K/uL (ref 0.1–1.0)
Monocytes Relative: 9.2 % (ref 3.0–12.0)
Neutro Abs: 4.2 K/uL (ref 1.4–7.7)
Neutrophils Relative %: 59.5 % (ref 43.0–77.0)
Platelets: 214 K/uL (ref 150.0–400.0)
RBC: 4.88 Mil/uL (ref 4.22–5.81)
RDW: 13.3 % (ref 11.5–15.5)
WBC: 7 K/uL (ref 4.0–10.5)

## 2022-12-31 LAB — COMPREHENSIVE METABOLIC PANEL
ALT: 23 U/L (ref 0–53)
AST: 23 U/L (ref 0–37)
Albumin: 4.5 g/dL (ref 3.5–5.2)
Alkaline Phosphatase: 63 U/L (ref 39–117)
BUN: 11 mg/dL (ref 6–23)
CO2: 29 mEq/L (ref 19–32)
Calcium: 9.4 mg/dL (ref 8.4–10.5)
Chloride: 103 mEq/L (ref 96–112)
Creatinine, Ser: 0.78 mg/dL (ref 0.40–1.50)
GFR: 100.36 mL/min (ref >60.00)
Glucose, Bld: 91 mg/dL (ref 70–99)
Potassium: 4 mEq/L (ref 3.5–5.1)
Sodium: 138 mEq/L (ref 135–145)
Total Bilirubin: 0.4 mg/dL (ref 0.2–1.2)
Total Protein: 7.6 g/dL (ref 6.0–8.3)

## 2022-12-31 LAB — HEMOGLOBIN A1C: Hgb A1c MFr Bld: 6.2 % (ref 4.6–6.5)

## 2022-12-31 LAB — TSH: TSH: 2.22 u[IU]/mL (ref 0.35–5.50)

## 2022-12-31 LAB — LIPASE: Lipase: 36 U/L (ref 11.0–59.0)

## 2022-12-31 MED ORDER — CIPROFLOXACIN HCL 500 MG PO TABS: 500.0000 mg | ORAL_TABLET | Freq: Two times a day (BID) | ORAL | 0 refills | Status: AC

## 2022-12-31 NOTE — Progress Notes (Signed)
Sean Watkins 57 y.o.   Chief Complaint  Patient presents with   Follow-up    F/u stomach pain, digestive issues x 2 weeks     HISTORY OF PRESENT ILLNESS: This is a 57 y.o. male ate a "bad chicken sandwich" about 2 weeks ago and soon after developed intractable hiccups with heartburn which got better after 2 days.  Now mostly complaining of intermittent intestinal rumbling with increased gas and burping.  Denies diarrhea.  Able to move bowels daily but urgency and frequency to go has increased. No other associated symptoms. No other complaints or medical concerns today.  HPI   Prior to Admission medications   Medication Sig Start Date End Date Taking? Authorizing Provider  atorvastatin (LIPITOR) 20 MG tablet Take 1 tablet (20 mg total) by mouth daily. 03/06/22   Littie Deeds, MD  timolol (BETIMOL) 0.25 % ophthalmic solution 1-2 drops 2 (two) times daily.    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  Social History   Socioeconomic History   Marital status: Married    Spouse name: Not on file   Number of children: 2   Years of education: Not on file   Highest education level: Not on file  Occupational History   Not on file  Tobacco Use   Smoking status: Never   Smokeless tobacco: Never  Vaping Use   Vaping Use: Never used  Substance and Sexual Activity   Alcohol use: No   Drug use: No    Sexual activity: Yes  Other Topics Concern   Not on file  Social History Narrative   Right Handed    Lives in a one story home.   Social Determinants of Health   Financial Resource Strain: Not on file  Food Insecurity: Not on file  Transportation Needs: Not on file  Physical Activity: Not on file  Stress: Not on file  Social Connections: Not on file  Intimate Partner Violence: Not on file    Family History  Problem Relation Age of Onset   Heart disease Mother    Diabetes Mother    Stroke Father      Review of Systems  Constitutional: Negative.  Negative for chills and fever.  HENT: Negative.  Negative for congestion and sore throat.   Respiratory: Negative.  Negative for cough and shortness of breath.   Cardiovascular: Negative.  Negative for chest pain and palpitations.  Gastrointestinal:  Positive for abdominal pain and heartburn. Negative for blood in stool, diarrhea, melena, nausea and vomiting.  Genitourinary: Negative.  Negative for dysuria and hematuria.  Skin: Negative.  Negative for rash.  Neurological: Negative.  Negative for dizziness.  All other systems reviewed and are negative.   Today's Vitals   12/31/22 1511  BP: (!) 140/76  Pulse: Marland Kitchen)  57  Temp: 98.2 F (36.8 C)  TempSrc: Oral  SpO2: 95%  Weight: 168 lb 4 oz (76.3 kg)  Height: 5\' 6"  (1.676 m)   Body mass index is 27.16 kg/m. Wt Readings from Last 3 Encounters:  12/31/22 168 lb 4 oz (76.3 kg)  09/19/22 166 lb (75.3 kg)  08/22/22 165 lb 6 oz (75 kg)    Physical Exam Vitals reviewed.  Constitutional:      Appearance: Normal appearance.  HENT:     Head: Normocephalic.     Mouth/Throat:     Mouth: Mucous membranes are moist.     Pharynx: Oropharynx is clear.  Eyes:     Extraocular Movements: Extraocular movements intact.     Conjunctiva/sclera: Conjunctivae normal.     Pupils: Pupils are equal, round, and reactive to light.  Cardiovascular:     Rate and Rhythm: Normal rate and regular  rhythm.     Pulses: Normal pulses.     Heart sounds: Normal heart sounds.  Pulmonary:     Effort: Pulmonary effort is normal.     Breath sounds: Normal breath sounds.  Abdominal:     Palpations: Abdomen is soft.     Tenderness: There is no abdominal tenderness.  Musculoskeletal:     Cervical back: No tenderness.  Lymphadenopathy:     Cervical: No cervical adenopathy.  Skin:    General: Skin is warm and dry.  Neurological:     General: No focal deficit present.     Mental Status: He is alert and oriented to person, place, and time.  Psychiatric:        Mood and Affect: Mood normal.        Behavior: Behavior normal.      ASSESSMENT & PLAN: A total of 46 minutes was spent with the patient and counseling/coordination of care regarding preparing for this visit, review of most recent office visit notes, review of most recent blood work results, differential diagnosis for bowel dysfunction and management, education on nutrition, need for probiotics, prognosis, documentation, and need for follow-up.  Problem List Items Addressed This Visit       Digestive   Bowel dysfunction - Primary    Clinically stable.  No red flag signs or symptoms Stable vital signs and benign abdominal examination. Differential diagnosis discussed. May be developing some sort of food intolerance. Advised to start by avoiding dairy products. May have residual bacterial infection from "bad sandwich" Recommend Cipro 500 mg twice a day for 3 days. Recommend to start probiotics daily for the next several weeks May need GI consult and colonoscopy if no better within the next several weeks. Diet and nutrition discussed.      Relevant Orders   CBC with Differential/Platelet   Comprehensive metabolic panel   Lipase   Hemoglobin A1c   TSH   Patient Instructions  Probiticos Probiotics Los probiticos son bacterias y hongos beneficiosos que viven en el organismo y cuidan la salud del aparato digestivo.  Adems, ayudan al sistema de defensa del organismo (sistema inmunitario) y protegen al cuerpo contra la proliferacin de bacterias nocivas. El mdico puede recomendarle tomar un probitico si toma medicamentos antibiticos o tiene ciertas afecciones mdicas, tales como: Diarrea. Estreimiento. Sndrome de colon irritable. Infecciones pulmonares. Infecciones por hongos. Acn, eczema y otras enfermedades de la piel. Infeccin de las vas urinarias frecuentes. Qu cosas afectan el equilibrio de las bacterias en mi cuerpo? El equilibrio de las bacterias beneficiosas en el cuerpo puede verse afectado por: Antibiticos. Estos  medicamentos se utilizan para tratar las infecciones causadas por bacterias. Lamentablemente, pueden matar no solo a las bacterias que son nocivas para el organismo, sino tambin a aquellas que son beneficiosas. Ciertas afecciones crnicas. Las afecciones relacionadas con un desequilibrio de las bacterias son: Las infecciones estomacales e intestinales (gastrointestinales). Infecciones pulmonares. Infecciones cutneas. Infecciones vaginales. Enfermedades inflamatorias del intestino. Las lceras estomacales (lceras gstricas). Caries y enfermedad de las encas (enfermedad periodontal). Estrs. Dieta con bajo contenido de [place]. Qu tipo de probitico es adecuado para m? Los probiticos contienen tipos diferentes de bacterias (cepas). Las cepas que se encuentran con frecuencia en los probiticos son: Lactobacillus. Saccharomyces. Bifidobacterium. Hay cepas especficas que han demostrado ser ms eficaces para determinadas enfermedades. Consulte al mdico qu cepa o cepas debe consumir y con qu frecuencia. Los probiticos vienen en muchas presentaciones, combinaciones de cepas y Tree surgeon. Es posible que algunos deban refrigerarse. Lea siempre la etiqueta para conocer las instrucciones de uso y Barista. Ciertos alimentos, como el yogur, contienen  probiticos. Los probiticos tambin pueden comprarse como un suplemento en una [place], [place] de alimentos naturales o [place] de comestibles. Hable con el mdico antes de empezar a tomar cualquier suplemento nuevo. Cules son los efectos secundarios de los probiticos? Algunas personas tienen efectos secundarios cuando toman probiticos. Generalmente, estos efectos son temporales y pueden incluir lo siguiente: Gases. Meteorismo. Calambres. Los efectos secundarios graves son raros. Siga estas indicaciones en su casa:  Consuma alimentos ricos en fibra, como cereales integrales, frijoles y verduras. Estos alimentos pueden favorecer el crecimiento de bacteria beneficiosa. Evite ciertos alimentos como se lo haya indicado el mdico. Si toma probiticos con medicamentos antibiticos, tmelos como se lo haya indicado el mdico. Es posible que deba tomar probiticos durante varias semanas. Esto es para ayudar a la proliferacin de bacterias buenas en el intestino. Dnde obtener ms informacin Corporate treasurer for Probiotics and Prebiotics (Asociacin Cientfica Internacional de Probiticos y Prebiticos): isappscience.org Resumen Los probiticos son bacterias y hongos beneficiosos que viven en el organismo y cuidan su salud y la salud del aparato digestivo. Ciertos alimentos, como el yogur, contienen probiticos. Los probiticos pueden tomarse como un suplemento. Pueden comprarse en [place], tienda de alimentos naturales o tienda de comestibles. Vienen en muchas presentaciones, combinaciones de cepas y Production designer, theatre/television/film. Asegrese de Teacher, adult education a su mdico antes de tomar nuevos suplementos. Esta informacin no tiene Marine scientist el consejo del mdico. Asegrese de hacerle al mdico cualquier pregunta que tenga. Document Revised: 10/18/2021 Document Reviewed: 10/18/2021 Elsevier Patient Education  [place], MD [facility]  [facility]

## 2022-12-31 NOTE — Assessment & Plan Note (Signed)
Clinically stable.  No red flag signs or symptoms Stable vital signs and benign abdominal examination. Differential diagnosis discussed. May be developing some sort of food intolerance. Advised to start by avoiding dairy products. May have residual bacterial infection from "bad sandwich" Recommend Cipro 500 mg twice a day for 3 days. Recommend to start probiotics daily for the next several weeks May need GI consult and colonoscopy if no better within the next several weeks. Diet and nutrition discussed.

## 2022-12-31 NOTE — Patient Instructions (Signed)
Probiticos Probiotics Los probiticos son bacterias y hongos beneficiosos que viven en el organismo y cuidan la salud del aparato digestivo. Adems, ayudan al sistema de defensa del organismo (sistema inmunitario) y protegen al cuerpo contra la proliferacin de bacterias nocivas. El mdico puede recomendarle tomar un probitico si toma medicamentos antibiticos o tiene ciertas afecciones mdicas, tales como: Diarrea. Estreimiento. Sndrome de colon irritable. Infecciones pulmonares. Infecciones por hongos. Acn, eczema y otras enfermedades de la piel. Infeccin de las vas urinarias frecuentes. Qu cosas afectan el equilibrio de las bacterias en mi cuerpo? El equilibrio de las bacterias beneficiosas en el cuerpo puede verse afectado por: Antibiticos. Estos medicamentos se utilizan para tratar las infecciones causadas por bacterias. Lamentablemente, pueden matar no solo a las bacterias que son nocivas para el organismo, sino tambin a aquellas que son beneficiosas. Ciertas afecciones crnicas. Las afecciones relacionadas con un desequilibrio de las bacterias son: Las infecciones estomacales e intestinales (gastrointestinales). Infecciones pulmonares. Infecciones cutneas. Infecciones vaginales. Enfermedades inflamatorias del intestino. Las lceras estomacales (lceras gstricas). Caries y enfermedad de las encas (enfermedad periodontal). Estrs. Dieta con bajo contenido de Crum. Qu tipo de probitico es adecuado para m? Los probiticos contienen tipos diferentes de bacterias (cepas). Las cepas que se encuentran con frecuencia en los probiticos son: Lactobacillus. Saccharomyces. Bifidobacterium. Hay cepas especficas que han demostrado ser ms eficaces para determinadas enfermedades. Consulte al mdico qu cepa o cepas debe consumir y con qu frecuencia. Los probiticos vienen en muchas presentaciones, combinaciones de cepas y Tree surgeon. Es posible que algunos  deban refrigerarse. Lea siempre la etiqueta para conocer las instrucciones de uso y Barista. Ciertos alimentos, como el yogur, contienen probiticos. Los probiticos tambin pueden comprarse como un suplemento en una [place], [place] de alimentos naturales o [place] de comestibles. Hable con el mdico antes de empezar a tomar cualquier suplemento nuevo. Cules son los efectos secundarios de los probiticos? Algunas personas tienen efectos secundarios cuando toman probiticos. Generalmente, estos efectos son temporales y pueden incluir lo siguiente: Gases. Meteorismo. Calambres. Los efectos secundarios graves son raros. Siga estas indicaciones en su casa:  Consuma alimentos ricos en fibra, como cereales integrales, frijoles y verduras. Estos alimentos pueden favorecer el crecimiento de bacteria beneficiosa. Evite ciertos alimentos como se lo haya indicado el mdico. Si toma probiticos con medicamentos antibiticos, tmelos como se lo haya indicado el mdico. Es posible que deba tomar probiticos durante varias semanas. Esto es para ayudar a la proliferacin de bacterias buenas en el intestino. Dnde obtener ms informacin Corporate treasurer for Probiotics and Prebiotics (Asociacin Cientfica Internacional de Probiticos y Prebiticos): isappscience.org Resumen Los probiticos son bacterias y hongos beneficiosos que viven en el organismo y cuidan su salud y la salud del aparato digestivo. Ciertos alimentos, como el yogur, contienen probiticos. Los probiticos pueden tomarse como un suplemento. Pueden comprarse en [place], tienda de alimentos naturales o tienda de comestibles. Vienen en muchas presentaciones, combinaciones de cepas y Production designer, theatre/television/film. Asegrese de Teacher, adult education a su mdico antes de tomar nuevos suplementos. Esta informacin no tiene Marine scientist el consejo del mdico. Asegrese de hacerle al mdico cualquier pregunta que tenga. Document Revised:  10/18/2021 Document Reviewed: 10/18/2021 Elsevier Patient Education  [place].

## 2023-04-06 ENCOUNTER — Other Ambulatory Visit: Payer: Self-pay | Admitting: Family Medicine

## 2023-09-08 ENCOUNTER — Ambulatory Visit (INDEPENDENT_AMBULATORY_CARE_PROVIDER_SITE_OTHER): Payer: Commercial Managed Care - HMO | Admitting: Emergency Medicine

## 2023-09-08 ENCOUNTER — Encounter: Payer: Self-pay | Admitting: Emergency Medicine

## 2023-09-08 ENCOUNTER — Telehealth: Payer: Self-pay | Admitting: Emergency Medicine

## 2023-09-08 VITALS — BP 116/82 | HR 59 | Temp 98.1°F | Wt 176.0 lb

## 2023-09-08 DIAGNOSIS — I10 Essential (primary) hypertension: Secondary | ICD-10-CM

## 2023-09-08 DIAGNOSIS — R399 Unspecified symptoms and signs involving the genitourinary system: Secondary | ICD-10-CM | POA: Diagnosis not present

## 2023-09-08 DIAGNOSIS — E785 Hyperlipidemia, unspecified: Secondary | ICD-10-CM

## 2023-09-08 LAB — COMPREHENSIVE METABOLIC PANEL
ALT: 22 U/L (ref 0–53)
AST: 21 U/L (ref 0–37)
Albumin: 4 g/dL (ref 3.5–5.2)
Alkaline Phosphatase: 58 U/L (ref 39–117)
BUN: 14 mg/dL (ref 6–23)
CO2: 24 meq/L (ref 19–32)
Calcium: 8.9 mg/dL (ref 8.4–10.5)
Chloride: 106 meq/L (ref 96–112)
Creatinine, Ser: 0.71 mg/dL (ref 0.40–1.50)
GFR: 102.75 mL/min (ref >60.00)
Glucose, Bld: 95 mg/dL (ref 70–99)
Potassium: 4.2 meq/L (ref 3.5–5.1)
Sodium: 138 mEq/L (ref 135–145)
Total Bilirubin: 0.5 mg/dL (ref 0.2–1.2)
Total Protein: 6.6 g/dL (ref 6.0–8.3)

## 2023-09-08 LAB — VITAMIN D 25 HYDROXY (VIT D DEFICIENCY, FRACTURES): VITD: 18.89 ng/mL — ABNORMAL LOW (ref 30.00–100.00)

## 2023-09-08 LAB — LIPID PANEL
Cholesterol: 155 mg/dL (ref 0–200)
HDL: 41.8 mg/dL (ref >39.00)
LDL Cholesterol: 94 mg/dL (ref 0–99)
NonHDL: 113.67
Total CHOL/HDL Ratio: 4
Triglycerides: 96 mg/dL (ref 0.0–149.0)
VLDL: 19.2 mg/dL (ref 0.0–40.0)

## 2023-09-08 LAB — URINALYSIS
Bilirubin Urine: NEGATIVE
Hgb urine dipstick: NEGATIVE
Ketones, ur: NEGATIVE
Leukocytes,Ua: NEGATIVE
Nitrite: NEGATIVE
Specific Gravity, Urine: 1.015 (ref 1.000–1.030)
Total Protein, Urine: NEGATIVE
Urine Glucose: NEGATIVE
Urobilinogen, UA: 0.2 (ref 0.0–1.0)
pH: 7 (ref 5.0–8.0)

## 2023-09-08 LAB — CBC WITH DIFFERENTIAL/PLATELET
Basophils Absolute: 0 10*3/uL (ref 0.0–0.1)
Basophils Relative: 0.6 % (ref 0.0–3.0)
Eosinophils Absolute: 0.4 10*3/uL (ref 0.0–0.7)
Eosinophils Relative: 4.7 % (ref 0.0–5.0)
HCT: 42.3 % (ref 39.0–52.0)
Hemoglobin: 13.8 g/dL (ref 13.0–17.0)
Lymphocytes Relative: 29.4 % (ref 12.0–46.0)
Lymphs Abs: 2.2 10*3/uL (ref 0.7–4.0)
MCHC: 32.6 g/dL (ref 30.0–36.0)
MCV: 88.6 fl (ref 78.0–100.0)
Monocytes Absolute: 0.5 10*3/uL (ref 0.1–1.0)
Monocytes Relative: 6.4 % (ref 3.0–12.0)
Neutro Abs: 4.5 10*3/uL (ref 1.4–7.7)
Neutrophils Relative %: 58.9 % (ref 43.0–77.0)
Platelets: 206 10*3/uL (ref 150.0–400.0)
RBC: 4.77 Mil/uL (ref 4.22–5.81)
RDW: 13.7 % (ref 11.5–15.5)
WBC: 7.6 10*3/uL (ref 4.0–10.5)

## 2023-09-08 LAB — VITAMIN B12: Vitamin B-12: 554 pg/mL (ref 211–911)

## 2023-09-08 LAB — HEMOGLOBIN A1C: Hgb A1c MFr Bld: 6.1 % (ref 4.6–6.5)

## 2023-09-08 LAB — PSA: PSA: 1.04 ng/mL (ref 0.10–4.00)

## 2023-09-08 MED ORDER — TAMSULOSIN HCL 0.4 MG PO CAPS: 0.4000 mg | ORAL_CAPSULE | Freq: Every day | ORAL | 3 refills | Status: AC

## 2023-09-08 NOTE — Assessment & Plan Note (Signed)
Active and affecting quality of life. Recommend urinalysis and PSA today Recommend urology evaluation.  Referral placed today Will benefit from starting tamsulosin 0.4 mg daily

## 2023-09-08 NOTE — Assessment & Plan Note (Signed)
BP Readings from Last 3 Encounters:  09/08/23 116/82  12/31/22 120/70  09/19/22 (!) 148/89  Well-controlled hypertension, off medications. Cardiovascular risk associated with hypertension discussed. Diet and nutrition discussed.

## 2023-09-08 NOTE — Assessment & Plan Note (Signed)
Diet and nutrition discussed Cardiovascular risks associated with dyslipidemia discussed Lipid profile done today Continue atorvastatin 20 mg daily The 10-year ASCVD risk score (Arnett DK, et al., 2019) is: 5.7%   Values used to calculate the score:     Age: 57 years     Sex: Male     Is Non-Hispanic African American: No     Diabetic: No     Tobacco smoker: No     Systolic Blood Pressure: 116 mmHg     Is BP treated: Yes     HDL Cholesterol: 48.2 mg/dL     Total Cholesterol: 186 mg/dL

## 2023-09-08 NOTE — Patient Instructions (Signed)
Mantenimiento de Research officer, political party, Male Adoptar un estilo de vida saludable y recibir atencin preventiva son importantes para promover la salud y Counsellor. Consulte al mdico sobre: El esquema adecuado para hacerse pruebas y exmenes peridicos. Cosas que puede hacer por su cuenta para prevenir enfermedades y [place] sano. Qu debo saber sobre la dieta, el peso y el ejercicio? Consuma una dieta saludable  Consuma una dieta que incluya muchas verduras, frutas, productos lcteos con bajo contenido de [place] y Associate Professor. No consuma muchos alimentos ricos en grasas slidas, azcares agregados o sodio. Mantenga un peso saludable El ndice de masa muscular [facility]) es una medida que puede utilizarse para identificar posibles problemas de [place]. Proporciona una estimacin de la grasa corporal basndose en el peso y la altura. Su mdico puede ayudarle a Engineer, site [facility] y a Personnel officer o Pharmacologist un peso saludable. Haga ejercicio con regularidad Haga ejercicio con regularidad. Esta es una de las prcticas ms importantes que puede hacer por su salud. La [employer] de los adultos deben seguir estas pautas: Education officer, environmental, al menos, 150 minutos de actividad fsica por semana. El ejercicio debe aumentar la frecuencia cardaca y Media planner transpirar (ejercicio de intensidad moderada). Hacer ejercicios de fortalecimiento por lo [employer] por semana. Agregue esto a su plan de ejercicio de intensidad moderada. Pase menos tiempo sentado. Incluso la actividad fsica ligera puede ser beneficiosa. Controle sus niveles de colesterol y lpidos en la sangre Comience a realizarse anlisis de lpidos y Oncologist en la sangre a los 20 aos y luego reptalos cada 5 aos. Es posible que Insurance underwriter los niveles de colesterol con mayor frecuencia si: Sus niveles de lpidos y colesterol son altos. Es mayor de 40 aos. Presenta un alto riesgo de padecer enfermedades cardacas. Qu debo  saber sobre las pruebas de deteccin del cncer? Muchos tipos de cncer pueden detectarse de manera temprana y, a menudo, pueden prevenirse. Segn su historia clnica y sus antecedentes familiares, es posible que deba realizarse pruebas de deteccin del cncer en diferentes edades. Esto puede incluir pruebas de deteccin de lo siguiente: Building services engineer. Cncer de prstata. Cncer de piel. Cncer de pulmn. Qu debo saber sobre la enfermedad cardaca, la diabetes y la hipertensin arterial? Presin arterial y enfermedad cardaca La hipertensin arterial causa enfermedades cardacas y [place] el riesgo de accidente cerebrovascular. Es ms probable que esto se manifieste en las personas que tienen lecturas de presin arterial alta o tienen sobrepeso. Hable con el mdico sobre sus valores de presin arterial deseados. Hgase controlar la presin arterial: Cada 3 a 5 aos si tiene entre 18 y 56 aos. Todos los aos si es mayor de 40 aos. Si tiene entre 65 y 41 aos y es fumador o Insurance underwriter, pregntele al mdico si debe realizarse una prueba de deteccin de aneurisma artico abdominal (AAA) por nica vez. Diabetes Realcese exmenes de deteccin de la diabetes con regularidad. Este anlisis revisa el nivel de azcar en la sangre en [place]. Hgase las pruebas de deteccin: Cada tres aos despus de los 45 aos de edad si tiene un peso normal y un bajo riesgo de padecer diabetes. Con ms frecuencia y a partir de [place] edad inferior si tiene sobrepeso o un alto riesgo de padecer diabetes. Qu debo saber sobre la prevencin de infecciones? Hepatitis B Si tiene un riesgo ms alto de contraer hepatitis B, debe someterse a un examen de deteccin de este virus. Hable con el mdico para averiguar si tiene riesgo de  contraer la infeccin por hepatitis B. Hepatitis C Se recomienda un anlisis de [place] para: Todos los que nacieron entre 1944 y [phone]. Todas las personas que tengan un riesgo de haber  contrado hepatitis C. Enfermedades de transmisin sexual (ETS) Debe realizarse pruebas de deteccin de ITS todos los aos, incluidas la gonorrea y la clamidia, si: Es sexualmente activo y es menor de [address]. Es mayor de [address], y Public affairs consultant informa que corre riesgo de tener este tipo de infecciones. La actividad sexual ha cambiado desde que le hicieron la ltima prueba de deteccin y tiene un riesgo mayor de Warehouse manager clamidia o Copy. Pregntele al mdico si usted tiene riesgo. Pregntele al mdico si usted tiene un alto riesgo de Primary school teacher VIH. El mdico tambin puede recomendarle un medicamento recetado para ayudar a evitar la infeccin por el VIH. Si elige tomar medicamentos para prevenir el VIH, primero debe [employer] de deteccin del VIH. Luego debe hacerse anlisis cada 3 meses mientras est tomando los medicamentos. Siga estas indicaciones en su casa: Consumo de alcohol No beba alcohol si el mdico se lo prohbe. Si bebe alcohol: Limite la cantidad que consume de 0 a 2 bebidas por da. Sepa cunta cantidad de alcohol hay en las bebidas que toma. En los [address], una medida equivale a una botella de cerveza de 12 oz (355 ml), un vaso de vino de 5 oz (148 ml) o un vaso de una bebida alcohlica de alta graduacin de 1 oz (44 ml). Estilo de vida No consuma ningn producto que contenga nicotina o tabaco. Estos productos incluyen cigarrillos, tabaco para Theatre manager y aparatos de vapeo, como los Administrator, Civil Service. Si necesita ayuda para dejar de consumir estos productos, consulte al mdico. No consuma drogas. No comparta agujas. Solicite ayuda a su mdico si necesita apoyo o informacin para abandonar las drogas. Indicaciones generales Realcese los estudios de rutina de [address], dentales y de Wellsite geologist. Mantngase al da con las vacunas. Infrmele a su mdico si: Se siente deprimido con frecuencia. Alguna vez ha sido vctima de [place] o no se siente seguro en su  casa. Resumen Adoptar un estilo de vida saludable y recibir atencin preventiva son importantes para promover la salud y Counsellor. Siga las instrucciones del mdico acerca de una dieta saludable, el ejercicio y la realizacin de pruebas o exmenes para Hotel manager. Siga las instrucciones del mdico con respecto al control del colesterol y la presin arterial. Esta informacin no tiene Theme park manager el consejo del mdico. Asegrese de hacerle al mdico cualquier pregunta que tenga. Document Revised: 05/16/2021 Document Reviewed: 05/16/2021 Elsevier Patient Education  2024 [employer].

## 2023-09-08 NOTE — Telephone Encounter (Signed)
Blood results discussed with patient Low vitamin D level.  Recommended to start daily supplementation Rest of labs normal.  No other concerns.

## 2023-09-08 NOTE — Progress Notes (Signed)
Sean Watkins 57 y.o.   Chief complaint: Follow-up of chronic medical conditions and new onset lower urinary tract symptoms  HISTORY OF PRESENT ILLNESS: This is a 57 y.o. male here for follow-up of chronic medical conditions including hypertension and dyslipidemia. Complaining of urinary frequency and nocturia and unable to fully empty his bladder for the past 3 to 4 months Also requesting labs. No other complaints or medical concerns today.  HPI   Prior to Admission medications   Medication Sig Start Date End Date Taking? Authorizing Provider  atorvastatin (LIPITOR) 20 MG tablet TAKE 1 TABLET(20 MG) BY MOUTH DAILY 04/07/23  Yes Bradey Luzier, Eilleen Kempf, MD  timolol (BETIMOL) 0.25 % ophthalmic solution 1-2 drops 2 (two) times daily.   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  Social History   Socioeconomic History   Marital status: Married    Spouse name: Not on file   Number of children: 2   Years of education: Not on file   Highest education level: Not on file  Occupational History   Not on file  Tobacco Use   Smoking status: Never   Smokeless tobacco: Never  Vaping Use   Vaping status: Never Used  Substance and Sexual Activity   Alcohol use: No   Drug use: No   Sexual activity: Yes  Other Topics Concern   Not on file  Social History Narrative   Right  Handed    Lives in a one story home.   Social Determinants of Health   Financial Resource Strain: Not on file  Food Insecurity: Not on file  Transportation Needs: Not on file  Physical Activity: Not on file  Stress: Not on file  Social Connections: Not on file  Intimate Partner Violence: Not on file    Family History  Problem Relation Age of Onset   Heart disease Mother    Diabetes Mother    Stroke Father      Review of Systems  Constitutional: Negative.  Negative for chills and fever.  HENT: Negative.  Negative for congestion and sore throat.   Respiratory: Negative.  Negative for cough and shortness of breath.   Cardiovascular: Negative.  Negative for chest pain and palpitations.  Gastrointestinal:  Negative for abdominal pain, nausea and vomiting.  Genitourinary:  Positive for frequency and urgency.       Nocturia  Skin: Negative.  Negative for rash.  Neurological: Negative.  Negative for dizziness and headaches.  All other systems reviewed and are negative.   Vitals:   09/08/23 0955  BP: 116/82  Pulse: (!) 59  Temp: 98.1 F (36.7 C)  SpO2: 97%    Physical Exam Vitals reviewed.  Constitutional:      Appearance: Normal appearance.  HENT:  Head: Normocephalic.     Mouth/Throat:     Mouth: Mucous membranes are moist.     Pharynx: Oropharynx is clear.  Eyes:     Extraocular Movements: Extraocular movements intact.     Conjunctiva/sclera: Conjunctivae normal.     Pupils: Pupils are equal, round, and reactive to light.  Cardiovascular:     Rate and Rhythm: Normal rate and regular rhythm.     Pulses: Normal pulses.     Heart sounds: Normal heart sounds.  Pulmonary:     Effort: Pulmonary effort is normal.     Breath sounds: Normal breath sounds.  Abdominal:     Palpations: Abdomen is soft.     Tenderness: There is no abdominal tenderness.  Musculoskeletal:     Cervical back: No tenderness.  Lymphadenopathy:     Cervical: No cervical adenopathy.   Skin:    General: Skin is warm and dry.     Capillary Refill: Capillary refill takes less than 2 seconds.  Neurological:     Mental Status: He is alert and oriented to person, place, and time.  Psychiatric:        Mood and Affect: Mood normal.        Behavior: Behavior normal.      ASSESSMENT & PLAN: A total of 46 minutes was spent with the patient and counseling/coordination of care regarding preparing for this visit, review of most recent office visit notes, review of multiple chronic medical conditions under management, review of all medications, review of most recent blood work results, differential diagnosis of lower urinary tract symptoms and need for urology evaluation, cardiovascular risks associated with hypertension and dyslipidemia, education on nutrition, prognosis, documentation and need for follow-up.  Problem List Items Addressed This Visit       Cardiovascular and Mediastinum   Essential hypertension    BP Readings from Last 3 Encounters:  09/08/23 116/82  12/31/22 120/70  09/19/22 (!) 148/89  Well-controlled hypertension, off medications. Cardiovascular risk associated with hypertension discussed. Diet and nutrition discussed.       Relevant Orders   VITAMIN D 25 Hydroxy (Vit-D Deficiency, Fractures)   Vitamin B12   Comprehensive metabolic panel   CBC with Differential/Platelet     Other   Dyslipidemia    Diet and nutrition discussed Cardiovascular risks associated with dyslipidemia discussed Lipid profile done today Continue atorvastatin 20 mg daily The 10-year ASCVD risk score (Arnett DK, et al., 2019) is: 5.7%   Values used to calculate the score:     Age: 66 years     Sex: Male     Is Non-Hispanic African American: No     Diabetic: No     Tobacco smoker: No     Systolic Blood Pressure: 116 mmHg     Is BP treated: Yes     HDL Cholesterol: 48.2 mg/dL     Total Cholesterol: 186 mg/dL       Relevant Orders   VITAMIN D 25 Hydroxy (Vit-D  Deficiency, Fractures)   Vitamin B12   Lipid panel   Hemoglobin A1c   Comprehensive metabolic panel   CBC with Differential/Platelet   Lower urinary tract symptoms - Primary    Active and affecting quality of life. Recommend urinalysis and PSA today Recommend urology evaluation.  Referral placed today Will benefit from starting tamsulosin 0.4 mg daily      Relevant Medications   tamsulosin (FLOMAX) 0.4 MG CAPS capsule   Other Relevant Orders   PSA   CBC with  Differential/Platelet   Ambulatory referral to Urology   Urinalysis   Patient Instructions  Mantenimiento de la salud en los hombres Health Maintenance, Male Adoptar un estilo de vida saludable y recibir atencin preventiva son importantes para promover la salud y Counsellor. Consulte al mdico sobre: El esquema adecuado para hacerse pruebas y exmenes peridicos. Cosas que puede hacer por su cuenta para prevenir enfermedades y [place] sano. Qu debo saber sobre la dieta, el peso y el ejercicio? Consuma una dieta saludable  Consuma una dieta que incluya muchas verduras, frutas, productos lcteos con bajo contenido de [place] y Associate Professor. No consuma muchos alimentos ricos en grasas slidas, azcares agregados o sodio. Mantenga un peso saludable El ndice de masa muscular [facility]) es una medida que puede utilizarse para identificar posibles problemas de [place]. Proporciona una estimacin de la grasa corporal basndose en el peso y la altura. Su mdico puede ayudarle a Engineer, site [facility] y a Personnel officer o Pharmacologist un peso saludable. Haga ejercicio con regularidad Haga ejercicio con regularidad. Esta es una de las prcticas ms importantes que puede hacer por su salud. La [employer] de los adultos deben seguir estas pautas: Education officer, environmental, al menos, 150 minutos de actividad fsica por semana. El ejercicio debe aumentar la frecuencia cardaca y Media planner transpirar (ejercicio de intensidad moderada). Hacer ejercicios de fortalecimiento por  lo [employer] por semana. Agregue esto a su plan de ejercicio de intensidad moderada. Pase menos tiempo sentado. Incluso la actividad fsica ligera puede ser beneficiosa. Controle sus niveles de colesterol y lpidos en la sangre Comience a realizarse anlisis de lpidos y Oncologist en la sangre a los 20 aos y luego reptalos cada 5 aos. Es posible que Insurance underwriter los niveles de colesterol con mayor frecuencia si: Sus niveles de lpidos y colesterol son altos. Es mayor de 40 aos. Presenta un alto riesgo de padecer enfermedades cardacas. Qu debo saber sobre las pruebas de deteccin del cncer? Muchos tipos de cncer pueden detectarse de manera temprana y, a menudo, pueden prevenirse. Segn su historia clnica y sus antecedentes familiares, es posible que deba realizarse pruebas de deteccin del cncer en diferentes edades. Esto puede incluir pruebas de deteccin de lo siguiente: Building services engineer. Cncer de prstata. Cncer de piel. Cncer de pulmn. Qu debo saber sobre la enfermedad cardaca, la diabetes y la hipertensin arterial? Presin arterial y enfermedad cardaca La hipertensin arterial causa enfermedades cardacas y [place] el riesgo de accidente cerebrovascular. Es ms probable que esto se manifieste en las personas que tienen lecturas de presin arterial alta o tienen sobrepeso. Hable con el mdico sobre sus valores de presin arterial deseados. Hgase controlar la presin arterial: Cada 3 a 5 aos si tiene entre 18 y 63 aos. Todos los aos si es mayor de 40 aos. Si tiene entre 65 y 21 aos y es fumador o Insurance underwriter, pregntele al mdico si debe realizarse una prueba de deteccin de aneurisma artico abdominal (AAA) por nica vez. Diabetes Realcese exmenes de deteccin de la diabetes con regularidad. Este anlisis revisa el nivel de azcar en la sangre en [place]. Hgase las pruebas de deteccin: Cada tres aos despus de los 45 aos de edad si tiene un  peso normal y un bajo riesgo de padecer diabetes. Con ms frecuencia y a partir de [place] edad inferior si tiene sobrepeso o un alto riesgo de padecer diabetes. Qu debo saber sobre la prevencin de infecciones? Hepatitis B Si tiene un riesgo ms alto de contraer hepatitis B, debe someterse a un  examen de deteccin de este virus. Hable con el mdico para averiguar si tiene riesgo de contraer la infeccin por hepatitis B. Hepatitis C Se recomienda un anlisis de [place] para: Todos los que nacieron entre 1945 y [phone]. Todas las personas que tengan un riesgo de haber contrado hepatitis C. Enfermedades de transmisin sexual (ETS) Debe realizarse pruebas de deteccin de ITS todos los aos, incluidas la gonorrea y la clamidia, si: Es sexualmente activo y es menor de [address]. Es mayor de [address], y Public affairs consultant informa que corre riesgo de tener este tipo de infecciones. La actividad sexual ha cambiado desde que le hicieron la ltima prueba de deteccin y tiene un riesgo mayor de Warehouse manager clamidia o Copy. Pregntele al mdico si usted tiene riesgo. Pregntele al mdico si usted tiene un alto riesgo de Primary school teacher VIH. El mdico tambin puede recomendarle un medicamento recetado para ayudar a evitar la infeccin por el VIH. Si elige tomar medicamentos para prevenir el VIH, primero debe [employer] de deteccin del VIH. Luego debe hacerse anlisis cada 3 meses mientras est tomando los medicamentos. Siga estas indicaciones en su casa: Consumo de alcohol No beba alcohol si el mdico se lo prohbe. Si bebe alcohol: Limite la cantidad que consume de 0 a 2 bebidas por da. Sepa cunta cantidad de alcohol hay en las bebidas que toma. En los [address], una medida equivale a una botella de cerveza de 12 oz (355 ml), un vaso de vino de 5 oz (148 ml) o un vaso de una bebida alcohlica de alta graduacin de 1 oz (44 ml). Estilo de vida No consuma ningn producto que contenga nicotina o tabaco. Estos  productos incluyen cigarrillos, tabaco para Theatre manager y aparatos de vapeo, como los Administrator, Civil Service. Si necesita ayuda para dejar de consumir estos productos, consulte al mdico. No consuma drogas. No comparta agujas. Solicite ayuda a su mdico si necesita apoyo o informacin para abandonar las drogas. Indicaciones generales Realcese los estudios de rutina de [address], dentales y de Wellsite geologist. Mantngase al da con las vacunas. Infrmele a su mdico si: Se siente deprimido con frecuencia. Alguna vez ha sido vctima de [place] o no se siente seguro en su casa. Resumen Adoptar un estilo de vida saludable y recibir atencin preventiva son importantes para promover la salud y Counsellor. Siga las instrucciones del mdico acerca de una dieta saludable, el ejercicio y la realizacin de pruebas o exmenes para Hotel manager. Siga las instrucciones del mdico con respecto al control del colesterol y la presin arterial. Esta informacin no tiene Theme park manager el consejo del mdico. Asegrese de hacerle al mdico cualquier pregunta que tenga. Document Revised: 05/16/2021 Document Reviewed: 05/16/2021 Elsevier Patient Education  2024 Elsevier Inc.      Edwina Barth, MD [facility] [place]
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
## 2024-03-29 ENCOUNTER — Ambulatory Visit (INDEPENDENT_AMBULATORY_CARE_PROVIDER_SITE_OTHER)

## 2024-03-29 ENCOUNTER — Ambulatory Visit: Admitting: Emergency Medicine

## 2024-03-29 ENCOUNTER — Encounter: Payer: Self-pay | Admitting: Emergency Medicine

## 2024-03-29 VITALS — BP 140/90 | HR 52 | Temp 97.8°F | Ht 66.0 in | Wt 176.0 lb

## 2024-03-29 DIAGNOSIS — I10 Essential (primary) hypertension: Secondary | ICD-10-CM

## 2024-03-29 DIAGNOSIS — R079 Chest pain, unspecified: Secondary | ICD-10-CM

## 2024-03-29 DIAGNOSIS — E785 Hyperlipidemia, unspecified: Secondary | ICD-10-CM | POA: Diagnosis not present

## 2024-03-29 LAB — COMPREHENSIVE METABOLIC PANEL WITH GFR
ALT: 22 U/L (ref 0–53)
AST: 23 U/L (ref 0–37)
Albumin: 4.6 g/dL (ref 3.5–5.2)
Alkaline Phosphatase: 62 U/L (ref 39–117)
BUN: 18 mg/dL (ref 6–23)
CO2: 25 meq/L (ref 19–32)
Calcium: 9.5 mg/dL (ref 8.4–10.5)
Chloride: 105 meq/L (ref 96–112)
Creatinine, Ser: 0.74 mg/dL (ref 0.40–1.50)
GFR: 101.08 mL/min (ref >60.00)
Glucose, Bld: 107 mg/dL — ABNORMAL HIGH (ref 70–99)
Potassium: 4 meq/L (ref 3.5–5.1)
Sodium: 138 meq/L (ref 135–145)
Total Bilirubin: 0.5 mg/dL (ref 0.2–1.2)
Total Protein: 7.6 g/dL (ref 6.0–8.3)

## 2024-03-29 LAB — CBC WITH DIFFERENTIAL/PLATELET
Basophils Absolute: 0.1 10*3/uL (ref 0.0–0.1)
Basophils Relative: 0.8 % (ref 0.0–3.0)
Eosinophils Absolute: 0.6 10*3/uL (ref 0.0–0.7)
Eosinophils Relative: 8.7 % — ABNORMAL HIGH (ref 0.0–5.0)
HCT: 41.5 % (ref 39.0–52.0)
Hemoglobin: 14 g/dL (ref 13.0–17.0)
Lymphocytes Relative: 30.2 % (ref 12.0–46.0)
Lymphs Abs: 2.2 10*3/uL (ref 0.7–4.0)
MCHC: 33.7 g/dL (ref 30.0–36.0)
MCV: 88.9 fl (ref 78.0–100.0)
Monocytes Absolute: 0.4 10*3/uL (ref 0.1–1.0)
Monocytes Relative: 5.8 % (ref 3.0–12.0)
Neutro Abs: 4 10*3/uL (ref 1.4–7.7)
Neutrophils Relative %: 54.5 % (ref 43.0–77.0)
Platelets: 213 10*3/uL (ref 150.0–400.0)
RBC: 4.67 Mil/uL (ref 4.22–5.81)
RDW: 13.9 % (ref 11.5–15.5)
WBC: 7.3 10*3/uL (ref 4.0–10.5)

## 2024-03-29 LAB — LIPID PANEL
Cholesterol: 166 mg/dL (ref 0–200)
HDL: 39.9 mg/dL (ref >39.00)
LDL Cholesterol: 102 mg/dL — ABNORMAL HIGH (ref 0–99)
NonHDL: 126.03
Total CHOL/HDL Ratio: 4
Triglycerides: 119 mg/dL (ref 0.0–149.0)
VLDL: 23.8 mg/dL (ref 0.0–40.0)

## 2024-03-29 LAB — HEMOGLOBIN A1C: Hgb A1c MFr Bld: 6.2 % (ref 4.6–6.5)

## 2024-03-29 MED ORDER — ATORVASTATIN CALCIUM 20 MG PO TABS: 20.0000 mg | ORAL_TABLET | Freq: Every day | ORAL | 3 refills | Status: DC

## 2024-03-29 NOTE — Progress Notes (Signed)
 Sean Watkins 58 y.o.   Chief Complaint  Patient presents with   Chest Pain    Patient has been having chest pain for the past 3 months. No dizziness, no headaches.He states feeling a numbness deep in his chest that comes and goes. He feels the pain more in the afternoon, he states the only way for the pain to go away is walking on the treadmill for about 20-10mins. He mention he thinking he may have carpal tunnel on his right wrist he is familiar with the pain because he had on his left hand     HISTORY OF PRESENT ILLNESS: This is a 58 y.o. male complaining of intermittent episodes of left-sided chest pain for the last 3 months History of hypertension and dyslipidemia.  Non-smoker.  No associated symptoms Left-sided sharp pain without radiation.  Chest Pain  Pertinent negatives include no abdominal pain, cough, dizziness, fever, headaches, nausea, shortness of breath or vomiting.     Prior to Admission medications   Medication Sig Start Date End Date Taking? Authorizing Provider  timolol (BETIMOL) 0.25 % ophthalmic solution 1-2 drops 2 (two) times daily.   Yes [provider]  atorvastatin (LIPITOR) 20 MG tablet Take 1 tablet (20 mg total) by mouth daily. 03/29/24   Georgina Quint, MD  tamsulosin (FLOMAX) 0.4 MG CAPS capsule Take 1 capsule (0.4 mg total) by mouth daily. 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  Social History   Socioeconomic History   Marital status: Married    Spouse name: Not on file   Number of children: 2   Years of education: Not on file   Highest education level: Not on file  Occupational History   Not on file  Tobacco Use   Smoking status: Never   Smokeless tobacco: Never  Vaping Use   Vaping status: Never Used  Substance and Sexual Activity   Alcohol use: No   Drug use: No   Sexual activity: Yes  Other Topics Concern   Not on file  Social History Narrative   Right Handed    Lives in a one story home.   Social Drivers of Corporate investment banker Strain: Not on file  Food Insecurity: Not on file  Transportation Needs: Not on file  Physical Activity: Not on file  Stress: Not on file  Social Connections: Not on file  Intimate Partner Violence: Not on file    Family History  Problem Relation Age of Onset   Heart disease Mother    Diabetes Mother    Stroke Father      Review of Systems  Constitutional: Negative.  Negative for chills and fever.  HENT: Negative.  Negative for congestion and sore throat.   Respiratory: Negative.  Negative for cough and shortness of breath.   Cardiovascular:  Positive for chest pain.  Gastrointestinal:  Negative for abdominal pain, diarrhea, nausea and vomiting.  Genitourinary: Negative.  Negative for dysuria and hematuria.  Skin: Negative.  Negative for rash.  Neurological: Negative.  Negative for dizziness and headaches.  All other systems reviewed and are negative.   Vitals:   03/29/24 1024  BP: (!) 140/90  Pulse: (!) 52  Temp: 97.8 F (36.6 C)  SpO2: 98%    Physical Exam Vitals reviewed.  Constitutional:      Appearance: Normal appearance.  HENT:     Head: Normocephalic.     Mouth/Throat:     Mouth:  Mucous membranes are moist.     Pharynx: Oropharynx is clear.  Eyes:     Extraocular Movements: Extraocular movements intact.     Pupils: Pupils are equal, round, and reactive to light.  Cardiovascular:     Rate and Rhythm: Normal rate and regular rhythm.     Pulses: Normal pulses.     Heart sounds: Normal heart sounds.  Pulmonary:     Effort: Pulmonary effort is normal.     Breath sounds: Normal breath sounds.  Abdominal:     Palpations: Abdomen is soft.     Tenderness: There is no abdominal tenderness.  Musculoskeletal:     Cervical back: No tenderness.  Lymphadenopathy:     Cervical: No cervical adenopathy.  Skin:    General: Skin is warm and dry.     Capillary Refill: Capillary refill takes less than 2 seconds.  Neurological:     General: No focal deficit present.     Mental Status: He is alert and oriented to person, place, and time.  Psychiatric:        Mood and Affect: Mood normal.        Behavior: Behavior normal.     EKG: Sinus bradycardia with ventricular rate of 45/min.  No acute ischemic changes.  ASSESSMENT & PLAN: A total of 46 minutes was spent with the patient and counseling/coordination of care regarding preparing for this visit, review of most recent office visit notes, review of multiple chronic medical conditions and their management, differential diagnosis of chest pain and need for workup, review of all medications, review of most recent bloodwork results, review of health maintenance items, education on nutrition, prognosis, documentation, and need for follow up.   Problem List Items Addressed This Visit       Cardiovascular and Mediastinum   Essential hypertension   BP Readings from Last 3 Encounters:  03/29/24 (!) 140/90  09/08/23 116/82  12/31/22 120/70  Elevated blood pressure reading in the office today.  Off medication. EKG shows sinus bradycardia. No findings of hypertensive heart disease.       Relevant Medications   atorvastatin  (LIPITOR) 20 MG tablet   Other Relevant Orders   Comprehensive metabolic panel with GFR   Hemoglobin A1c     Other   Dyslipidemia   Diet and nutrition discussed Cardiovascular risks associated with dyslipidemia discussed Lipid profile done today Continue atorvastatin 20 mg daily      Relevant Medications   atorvastatin (LIPITOR) 20 MG tablet   Other Relevant Orders   Lipid panel   Hemoglobin A1c   Nonspecific chest pain - Primary   Clinically stable.  No red flag signs or symptoms.  Differential diagnosis discussed History of hypertension and dyslipidemia EKG shows sinus bradycardia but no acute ischemic changes Chest x-ray done today.  Will review images when available Recommend CT coronary for further evaluation May need cardiology referral after that ED precautions given      Relevant Orders   DG Chest 2 View   Comprehensive metabolic panel with GFR   Hemoglobin A1c   CBC with Differential/Platelet   CT CORONARY MORPH W/CTA COR W/SCORE W/CA W/CM &/OR WO/CM   Other Visit Diagnoses       Chest pain, unspecified type       Relevant Orders   CT CORONARY MORPH W/CTA COR W/SCORE W/CA W/CM &/OR WO/CM        Patient Instructions  Dolor de pecho inespecfico Nonspecific Chest Pain El dolor de pecho puede deberse a muchas afecciones diferentes. Algunas causas del dolor de pecho pueden ser potencialmente mortales. Estas requieren tratamiento inmediato. Algunas causas grave de dolor en el pecho son las siguientes: Infarto de miocardio. Un desgarro en el vaso sanguneo principal del cuerpo. Enrojecimiento e hinchazn (inflamacin) alrededor del corazn. Un cogulo de [employer]. Otras causas de dolor en el pecho pueden no ser tan graves. Estos incluyen los siguientes: Merchant navy officer. Ansiedad o estrs. Dao en los huesos o msculos del corazn. Infecciones pulmonares. El dolor de pecho puede provocar las siguientes sensaciones: Dolor o molestias en el  pecho. Dolor opresivo, continuo o constrictivo. Ardor u hormigueo. Dolor sordo o intenso que empeora al [employer], toser o inhalar profundamente. Dolor o [employer] tambin se sienten en la espalda, el cuello, la [place], el hombro o el brazo, o dolor que se extiende a cualquiera de estas zonas. Es difcil saber si la causa del dolor es algo grave o algo que no es tan grave. Por lo tanto, es importante que consulte al mdico inmediatamente si tiene Journalist, newspaper. Siga estas indicaciones en su casa: Medicamentos Use los medicamentos de venta libre y los recetados solamente como se lo haya indicado el mdico. Si le recetaron un antibitico, tmelo como se lo haya indicado el mdico. No deje de tomar el antibitico aunque comience a sentirse mejor. Estilo de vida  Haga reposo como se lo haya indicado el mdico. No consuma ningn producto que contenga nicotina o tabaco, como cigarrillos, cigarrillos electrnicos y tabaco de Theatre manager. Si necesita ayuda para dejar de consumir estos productos, consulte al mdico. No beba alcohol. Haga cambios en su estilo de vida segn las indicaciones del mdico. Estos pueden incluir lo siguiente: Education administrator actividad fsica con regularidad. Pregntele al mdico qu actividades son seguras para usted. Seguir una dieta cardiosaludable. Un especialista en dietas y alimentacin (nutricionista) puede ayudarlo a que haga elecciones saludables. Mantener un peso saludable. Tratar la diabetes o la presin arterial alta, si es necesario. Reducir el estrs. Las [employer] yoga y las tcnicas de relajacin pueden ayudar. Indicaciones generales Est atento a cualquier cambio en los sntomas. Informe a su mdico si presenta algn cambio en los sntomas o si aparecen sntomas nuevos. Evite las [employer] causen dolor de [place]. Concurra a todas las visitas de [address] se lo haya indicado el mdico. Esto es importante. Es posible que tenga que  someterse a ms estudios si el dolor de pecho no desaparece. Comunquese con un mdico si: El dolor de pecho no desaparece. Se siente deprimido. Tiene fiebre. Solicite ayuda inmediatamente si: El dolor en el pecho es ms intenso. Tiene tos que [place] o  tose con sangre. Tiene dolor muy intenso en el vientre (abdomen). Pierde el conocimiento (se desmaya). Tiene cualquiera de estos sntomas sin ninguna causa clara: Malestar repentino en el pecho. Molestias repentinas [employer], la espalda, el cuello o la [place]. Le falta el aire en cualquier momento. Comienza a sudar de [place] repentina o la piel se le humedece. Siente malestar estomacal (nuseas). Vomita. Se siente repentinamente mareado o se desmaya. Se siente muy dbil o cansado. El corazn comienza a latirle rpidamente o parece que se saltea latidos. Estos sntomas pueden Customer service manager. No espere a ver si los sntomas desaparecen. Solicite atencin mdica de inmediato. Comunquese con el servicio de emergencias de su localidad (911 en los [place]). No conduzca por sus propios medios [employer]. Resumen El dolor de pecho puede deberse a muchas afecciones diferentes. La causa puede ser grave y requerir tratamiento de inmediato. Si tiene dolor de pecho, consulte al mdico de inmediato. Siga las indicaciones del mdico para tomar los medicamentos y Radio producer cambios en su estilo de vida. Concurra a todas las visitas de seguimiento como se lo haya indicado el mdico. Esto incluye las visitas para realizarle otros estudios si el dolor de pecho no desaparece. Asegrese de [employer] signos que indican que su afeccin ha empeorado. Obtenga ayuda de inmediato si tiene esos sntomas. Esta informacin no tiene Theme park manager el consejo del mdico. Asegrese de hacerle al mdico cualquier pregunta que tenga. Document Revised: 03/15/2021 Document Reviewed: 10/24/2022 Elsevier Patient Education  2024 Elsevier  Inc.   Edwina Barth, MD [facility]

## 2024-03-29 NOTE — Patient Instructions (Signed)
Dolor de pecho inespecfico Nonspecific Chest Pain El dolor de pecho puede deberse a muchas afecciones diferentes. Algunas causas del dolor de pecho pueden ser potencialmente mortales. Estas requieren tratamiento inmediato. Algunas causas grave de dolor en el pecho son las siguientes: Infarto de miocardio. Un desgarro en el vaso sanguneo principal del cuerpo. Enrojecimiento e hinchazn (inflamacin) alrededor del corazn. Un cogulo de [employer]. Otras causas de dolor en el pecho pueden no ser tan graves. Estos incluyen los siguientes: Merchant navy officer. Ansiedad o estrs. Dao en los huesos o msculos del corazn. Infecciones pulmonares. El dolor de pecho puede provocar las siguientes sensaciones: Dolor o molestias en el pecho. Dolor opresivo, continuo o constrictivo. Ardor u hormigueo. Dolor sordo o intenso que empeora al [employer], toser o inhalar profundamente. Dolor o [employer] tambin se sienten en la espalda, el cuello, la [place], el hombro o el brazo, o dolor que se extiende a cualquiera de estas zonas. Es difcil saber si la causa del dolor es algo grave o algo que no es tan grave. Por lo tanto, es importante que consulte al mdico inmediatamente si tiene Journalist, newspaper. Siga estas indicaciones en su casa: Medicamentos Use los medicamentos de venta libre y los recetados solamente como se lo haya indicado el mdico. Si le recetaron un antibitico, tmelo como se lo haya indicado el mdico. No deje de tomar el antibitico aunque comience a sentirse mejor. Estilo de vida  Haga reposo como se lo haya indicado el mdico. No consuma ningn producto que contenga nicotina o tabaco, como cigarrillos, cigarrillos electrnicos y tabaco de Theatre manager. Si necesita ayuda para dejar de consumir estos productos, consulte al mdico. No beba alcohol. Haga cambios en su estilo de vida segn las indicaciones del mdico. Estos pueden incluir lo siguiente: Education administrator actividad fsica  con regularidad. Pregntele al mdico qu actividades son seguras para usted. Seguir una dieta cardiosaludable. Un especialista en dietas y alimentacin (nutricionista) puede ayudarlo a que haga elecciones saludables. Mantener un peso saludable. Tratar la diabetes o la presin arterial alta, si es necesario. Reducir el estrs. Las [employer] yoga y las tcnicas de relajacin pueden ayudar. Indicaciones generales Est atento a cualquier cambio en los sntomas. Informe a su mdico si presenta algn cambio en los sntomas o si aparecen sntomas nuevos. Evite las [employer] causen dolor de [place]. Concurra a todas las visitas de [address] se lo haya indicado el mdico. Esto es importante. Es posible que tenga que someterse a ms estudios si el dolor de pecho no desaparece. Comunquese con un mdico si: El dolor de pecho no desaparece. Se siente deprimido. Tiene fiebre. Solicite ayuda inmediatamente si: El dolor en el pecho es ms intenso. Tiene tos que empeora o tose con [place]. Tiene dolor muy intenso en el vientre (abdomen). Pierde el conocimiento (se desmaya). Tiene cualquiera de estos sntomas sin ninguna causa clara: Malestar repentino en el pecho. Molestias repentinas [employer], la espalda, el cuello o la [place]. Le falta el aire en cualquier momento. Comienza a sudar de [place] repentina o la piel se le humedece. Siente malestar estomacal (nuseas). Vomita. Se siente repentinamente mareado o se desmaya. Se siente muy dbil o cansado. El corazn comienza a latirle rpidamente o parece que se saltea latidos. Estos sntomas pueden Customer service manager. No espere a ver si los sntomas desaparecen. Solicite atencin mdica de inmediato. Comunquese con el servicio de emergencias de su localidad (911 en los [place]). No conduzca por sus propios  medios [employer]. Resumen El dolor de pecho puede deberse a muchas afecciones diferentes. La causa  puede ser grave y requerir tratamiento de inmediato. Si tiene dolor de pecho, consulte al mdico de inmediato. Siga las indicaciones del mdico para tomar los medicamentos y Radio producer cambios en su estilo de vida. Concurra a todas las visitas de seguimiento como se lo haya indicado el mdico. Esto incluye las visitas para realizarle otros estudios si el dolor de pecho no desaparece. Asegrese de [employer] signos que indican que su afeccin ha empeorado. Obtenga ayuda de inmediato si tiene esos sntomas. Esta informacin no tiene Theme park manager el consejo del mdico. Asegrese de hacerle al mdico cualquier pregunta que tenga. Document Revised: 03/15/2021 Document Reviewed: 10/24/2022 Elsevier Patient Education  2024 [employer].

## 2024-03-29 NOTE — Assessment & Plan Note (Signed)
 Clinically stable.  No red flag signs or symptoms. Differential diagnosis discussed History of hypertension and dyslipidemia EKG shows sinus bradycardia but no acute ischemic changes Chest x-ray done today.  Will review images when available Recommend CT coronary for further evaluation May need cardiology referral after that ED precautions given

## 2024-03-29 NOTE — Assessment & Plan Note (Signed)
 Diet and nutrition discussed Cardiovascular risks associated with dyslipidemia discussed Lipid profile done today Continue atorvastatin 20 mg daily

## 2024-03-29 NOTE — Assessment & Plan Note (Addendum)
 BP Readings from Last 3 Encounters:  03/29/24 (!) 140/90  09/08/23 116/82  12/31/22 120/70  Elevated blood pressure reading in the office today.  Off medication. EKG shows sinus bradycardia. No findings of hypertensive heart disease.

## 2024-03-30 ENCOUNTER — Other Ambulatory Visit (INDEPENDENT_AMBULATORY_CARE_PROVIDER_SITE_OTHER): Payer: Self-pay | Admitting: Radiology

## 2024-03-30 ENCOUNTER — Encounter: Payer: Self-pay | Admitting: Emergency Medicine

## 2024-03-30 DIAGNOSIS — R079 Chest pain, unspecified: Secondary | ICD-10-CM

## 2024-03-30 NOTE — Telephone Encounter (Signed)
 No concerns about either one, eosinophils or glucose.  Not affecting your immune system. Not worried about these numbers.

## 2024-04-05 ENCOUNTER — Telehealth: Payer: Self-pay | Admitting: Emergency Medicine

## 2024-04-05 NOTE — Telephone Encounter (Signed)
 Copied from CRM [id number]. Topic: Clinical - Lab/Test Results >> Apr 05, 2024  2:36 PM Carlatta H wrote: Reason for CRM: Order for CT CORONARY MORPH W/CTA COR W/SCORE W/CA W/CM &/OR WO/CM (Order [id number]) needs to be sent for prior authorization before patients appointment on 4/18

## 2024-04-06 ENCOUNTER — Encounter (HOSPITAL_COMMUNITY): Payer: Self-pay

## 2024-04-08 ENCOUNTER — Telehealth (HOSPITAL_COMMUNITY): Payer: Self-pay | Admitting: *Deleted

## 2024-04-08 NOTE — Telephone Encounter (Signed)
 Sent a message to [employer] for authorization

## 2024-04-08 NOTE — Telephone Encounter (Signed)
 Attempted to call patient regarding upcoming cardiac CT appointment. Left message on voicemail with name and callback number Johney Frame RN Navigator Cardiac Imaging [facility] [phone] Office

## 2024-04-09 ENCOUNTER — Ambulatory Visit (HOSPITAL_COMMUNITY)
Admission: RE | Admit: 2024-04-09 | Discharge: 2024-04-09 | Disposition: A | Discharge status: Home / Self Care | Source: Ambulatory Visit | Attending: Emergency Medicine | Admitting: Emergency Medicine

## 2024-04-09 DIAGNOSIS — R079 Chest pain, unspecified: Secondary | ICD-10-CM | POA: Diagnosis not present

## 2024-04-09 MED ORDER — METOPROLOL TARTRATE 5 MG/5ML IV SOLN: 10.0000 mg | INTRAVENOUS | Status: DC | PRN

## 2024-04-09 MED ORDER — NITROGLYCERIN 0.4 MG SL SUBL
SUBLINGUAL_TABLET | SUBLINGUAL | Status: AC
  Filled 2024-04-09: qty 2

## 2024-04-09 MED ORDER — DILTIAZEM HCL 25 MG/5ML IV SOLN: 10.0000 mg | INTRAVENOUS | Status: DC | PRN

## 2024-04-09 MED ORDER — NITROGLYCERIN 0.4 MG SL SUBL
0.8000 mg | SUBLINGUAL_TABLET | Freq: Once | SUBLINGUAL | Status: AC
  Administered 2024-04-09: 0.8 mg via SUBLINGUAL

## 2024-04-09 MED ORDER — IOHEXOL 350 MG/ML SOLN
100.0000 mL | Freq: Once | INTRAVENOUS | Status: AC | PRN
  Administered 2024-04-09: 100 mL via INTRAVENOUS

## 2024-04-12 ENCOUNTER — Encounter: Payer: Self-pay | Admitting: Emergency Medicine

## 2024-04-19 ENCOUNTER — Telehealth: Payer: Self-pay | Admitting: Emergency Medicine

## 2024-04-19 NOTE — Telephone Encounter (Signed)
 Copied from CRM [id number]. Topic: Clinical - Lab/Test Results >> Apr 19, 2024 12:24 PM Sean Watkins wrote: Reason for CRM: Patient is calling in to get results from latest test. Would prefer to have someone call that speaks spanish or if Dr Vedia Geralds is available

## 2024-04-27 NOTE — Telephone Encounter (Signed)
 Returned patients call LVM with interpreter for patient to call back

## 2024-07-09 ENCOUNTER — Encounter: Payer: Self-pay | Admitting: Advanced Practice Midwife

## 2024-07-12 DIAGNOSIS — H401131 Primary open-angle glaucoma, bilateral, mild stage: Secondary | ICD-10-CM | POA: Diagnosis not present

## 2024-08-27 DIAGNOSIS — N401 Enlarged prostate with lower urinary tract symptoms: Secondary | ICD-10-CM | POA: Diagnosis not present

## 2024-09-13 ENCOUNTER — Encounter: Payer: Self-pay | Admitting: Emergency Medicine

## 2024-09-13 ENCOUNTER — Ambulatory Visit: Admitting: Emergency Medicine

## 2024-09-13 ENCOUNTER — Ambulatory Visit: Payer: Self-pay | Admitting: Emergency Medicine

## 2024-09-13 VITALS — BP 138/90 | HR 50 | Temp 98.0°F | Ht 66.0 in | Wt 177.0 lb

## 2024-09-13 DIAGNOSIS — E785 Hyperlipidemia, unspecified: Secondary | ICD-10-CM

## 2024-09-13 DIAGNOSIS — I10 Essential (primary) hypertension: Secondary | ICD-10-CM

## 2024-09-13 DIAGNOSIS — Z0000 Encounter for general adult medical examination without abnormal findings: Secondary | ICD-10-CM | POA: Diagnosis not present

## 2024-09-13 DIAGNOSIS — Z1329 Encounter for screening for other suspected endocrine disorder: Secondary | ICD-10-CM | POA: Diagnosis not present

## 2024-09-13 DIAGNOSIS — Z130 Encounter for screening for diseases of the blood and blood-forming organs and certain disorders involving the immune mechanism: Secondary | ICD-10-CM | POA: Diagnosis not present

## 2024-09-13 DIAGNOSIS — Z0001 Encounter for general adult medical examination with abnormal findings: Secondary | ICD-10-CM

## 2024-09-13 DIAGNOSIS — H40003 Preglaucoma, unspecified, bilateral: Secondary | ICD-10-CM | POA: Diagnosis not present

## 2024-09-13 DIAGNOSIS — Z13228 Encounter for screening for other metabolic disorders: Secondary | ICD-10-CM

## 2024-09-13 DIAGNOSIS — R399 Unspecified symptoms and signs involving the genitourinary system: Secondary | ICD-10-CM

## 2024-09-13 DIAGNOSIS — R001 Bradycardia, unspecified: Secondary | ICD-10-CM

## 2024-09-13 DIAGNOSIS — Z125 Encounter for screening for malignant neoplasm of prostate: Secondary | ICD-10-CM | POA: Diagnosis not present

## 2024-09-13 LAB — HEMOGLOBIN A1C: Hgb A1c MFr Bld: 6.3 % (ref 4.6–6.5)

## 2024-09-13 LAB — CBC WITH DIFFERENTIAL/PLATELET
Basophils Absolute: 0 K/uL (ref 0.0–0.1)
Basophils Relative: 0.8 % (ref 0.0–3.0)
Eosinophils Absolute: 0.4 K/uL (ref 0.0–0.7)
Eosinophils Relative: 7.1 % — ABNORMAL HIGH (ref 0.0–5.0)
HCT: 41.4 % (ref 39.0–52.0)
Hemoglobin: 13.8 g/dL (ref 13.0–17.0)
Lymphocytes Relative: 35.7 % (ref 12.0–46.0)
Lymphs Abs: 2.1 K/uL (ref 0.7–4.0)
MCHC: 33.4 g/dL (ref 30.0–36.0)
MCV: 86.9 fl (ref 78.0–100.0)
Monocytes Absolute: 0.5 K/uL (ref 0.1–1.0)
Monocytes Relative: 7.9 % (ref 3.0–12.0)
Neutro Abs: 2.9 K/uL (ref 1.4–7.7)
Neutrophils Relative %: 48.5 % (ref 43.0–77.0)
Platelets: 192 K/uL (ref 150.0–400.0)
RBC: 4.76 Mil/uL (ref 4.22–5.81)
RDW: 14.1 % (ref 11.5–15.5)
WBC: 6 K/uL (ref 4.0–10.5)

## 2024-09-13 LAB — COMPREHENSIVE METABOLIC PANEL WITH GFR
ALT: 20 U/L (ref 0–53)
AST: 22 U/L (ref 0–37)
Albumin: 4.4 g/dL (ref 3.5–5.2)
Alkaline Phosphatase: 62 U/L (ref 39–117)
BUN: 18 mg/dL (ref 6–23)
CO2: 27 meq/L (ref 19–32)
Calcium: 9.4 mg/dL (ref 8.4–10.5)
Chloride: 103 meq/L (ref 96–112)
Creatinine, Ser: 0.74 mg/dL (ref 0.40–1.50)
GFR: 100.75 mL/min (ref >60.00)
Glucose, Bld: 108 mg/dL — ABNORMAL HIGH (ref 70–99)
Potassium: 4.4 meq/L (ref 3.5–5.1)
Sodium: 137 meq/L (ref 135–145)
Total Bilirubin: 0.5 mg/dL (ref 0.2–1.2)
Total Protein: 7 g/dL (ref 6.0–8.3)

## 2024-09-13 LAB — LIPID PANEL
Cholesterol: 221 mg/dL — ABNORMAL HIGH (ref 0–200)
HDL: 38.2 mg/dL — ABNORMAL LOW (ref >39.00)
LDL Cholesterol: 153 mg/dL — ABNORMAL HIGH (ref 0–99)
NonHDL: 182.95
Total CHOL/HDL Ratio: 6
Triglycerides: 150 mg/dL — ABNORMAL HIGH (ref 0.0–149.0)
VLDL: 30 mg/dL (ref 0.0–40.0)

## 2024-09-13 LAB — PSA: PSA: 1.38 ng/mL (ref 0.10–4.00)

## 2024-09-13 MED ORDER — ROSUVASTATIN CALCIUM 10 MG PO TABS: 10.0000 mg | ORAL_TABLET | Freq: Every day | ORAL | 3 refills | Status: DC

## 2024-09-13 NOTE — Assessment & Plan Note (Signed)
 Stable.  Sees ophthalmologist on a regular basis Has been on timolol eyedrops twice a day for about 10 years This is the cause of the sinus bradycardia

## 2024-09-13 NOTE — Assessment & Plan Note (Signed)
 Diet and nutrition discussed Cardiovascular risks associated with dyslipidemia discussed Not taking atorvastatin  due to lower abdominal itching Wants to start different statin Recommend Crestor  10 mg daily The 10-year ASCVD risk score (Arnett DK, et al., 2019) is: 8.8%   Values used to calculate the score:     Age: 58 years     Clincally relevant sex: Male     Is Non-Hispanic African American: No     Diabetic: No     Tobacco smoker: No     Systolic Blood Pressure: 138 mmHg     Is BP treated: Yes     HDL Cholesterol: 39.9 mg/dL     Total Cholesterol: 166 mg/dL

## 2024-09-13 NOTE — Progress Notes (Signed)
 Sean Watkins 58 y.o.   Chief Complaint  Patient presents with   Annual Exam    Patient here for physical. Patient mentions wanting to go over CT scans he had done on 04/09/24. He mentions also going to [facility] and want to go over those results as well in media tab. Patient states he has stopped atorvastatin  3-4 months ago states it was causing him lower Abdomen itchiness that went down to his groin area, he says the itchiness has stopped since he stopped the medication    HISTORY OF PRESENT ILLNESS: This is a 58 y.o. 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  HPI   Prior to Admission medications   Medication Sig Start Date End Date Taking? Authorizing Provider  timolol (BETIMOL) 0.25 % ophthalmic solution 1-2 drops 2 (two) times daily.   Yes [provider]  atorvastatin  (LIPITOR) 20 MG tablet Take 1 tablet (20 mg total) by mouth daily. Patient not taking: Reported on 09/13/2024 03/29/24   [facility] Emil Schanz, MD  tamsulosin  (FLOMAX ) 0.4 MG CAPS capsule Take 1 capsule (0.4 mg total) by mouth daily. Patient not taking: Reported on 09/13/2024 09/08/23   Purcell Emil Schanz, MD    Allergies  Allergen Reactions   Other     Narcotics - pt states can never take due to being truck driver   Indocin  [Indomethacin ] Rash    POSSIBLY due to indocin     Patient Active Problem List   Diagnosis Date Noted   Lower urinary tract symptoms 09/08/2023   Bowel dysfunction 12/31/2022   Trigeminal neuralgia of left side of face 08/22/2022   Dyslipidemia 05/29/2021   Essential hypertension 05/28/2021   Vitamin D  deficiency 05/28/2021   Glaucoma suspect of both eyes 06/28/2019   History of gout 07/16/2013    Past Medical  History:  Diagnosis Date   Allergy    Arthritis    Glaucoma    Gout    Hyperlipidemia     History reviewed. No pertinent surgical history.  Social History   Socioeconomic History   Marital status: Married    Spouse name: Not on file   Number of children: 2   Years of education: Not on file   Highest education level: Not on file  Occupational History   Not on file  Tobacco Use   Smoking status: Never   Smokeless tobacco: Never  Vaping Use   Vaping status: Never Used  Substance and Sexual Activity   Alcohol use: No   Drug use: No   Sexual activity: Yes  Other Topics Concern   Not on file  Social History Narrative   Right Handed    Lives in a one story home.   Social Drivers of Corporate investment banker Strain: Not on file  Food Insecurity: Not on file  Transportation Needs: Not on file  Physical Activity: Not on file  Stress: Not on file  Social Connections: Not on file  Intimate Partner Violence: Not on file    Family History  Problem Relation Age of Onset   Heart disease Mother    Diabetes Mother    Stroke Father      Review of Systems  Constitutional: Negative.  Negative for chills and fever.  HENT: Negative.  Negative for congestion and  sore throat.   Respiratory: Negative.  Negative for cough and shortness of breath.   Cardiovascular: Negative.  Negative for chest pain and palpitations.  Gastrointestinal:  Negative for abdominal pain, diarrhea, nausea and vomiting.  Genitourinary: Negative.  Negative for dysuria and hematuria.  Skin: Negative.  Negative for rash.  Neurological: Negative.  Negative for dizziness and headaches.  All other systems reviewed and are negative.   Vitals:   09/13/24 1009  BP: (!) 138/90  Pulse: (!) 50  Temp: 98 F (36.7 C)    Physical Exam Vitals reviewed.  Constitutional:      Appearance: Normal appearance.  HENT:     Head: Normocephalic.     Right Ear: Tympanic membrane, ear canal and external ear  normal.     Left Ear: Tympanic membrane, ear canal and external ear normal.     Mouth/Throat:     Mouth: Mucous membranes are moist.     Pharynx: Oropharynx is clear.  Eyes:     Extraocular Movements: Extraocular movements intact.     Pupils: Pupils are equal, round, and reactive to light.  Cardiovascular:     Rate and Rhythm: Normal rate and regular rhythm.     Pulses: Normal pulses.     Heart sounds: Normal heart sounds.  Pulmonary:     Effort: Pulmonary effort is normal.     Breath sounds: Normal breath sounds.  Abdominal:     Palpations: Abdomen is soft.     Tenderness: There is no abdominal tenderness.  Musculoskeletal:     Cervical back: No tenderness.  Lymphadenopathy:     Cervical: No cervical adenopathy.  Skin:    General: Skin is warm and dry.     Capillary Refill: Capillary refill takes less than 2 seconds.  Neurological:     General: No focal deficit present.     Mental Status: He is alert and oriented to person, place, and time.  Psychiatric:        Mood and Affect: Mood normal.        Behavior: Behavior normal.      ASSESSMENT & PLAN: Problem List Items Addressed This Visit       Cardiovascular and Mediastinum   Essential hypertension   BP Readings from Last 3 Encounters:  09/13/24 (!) 138/90  04/09/24 126/88  03/29/24 (!) 140/90  Elevated blood pressure reading in the office today.  Off medication. EKG shows sinus bradycardia. No findings of hypertensive heart disease. Normal cardiac CT scan 04/09/2024 Normal blood pressure readings at home Blood pressure lowers during exercise       Relevant Medications   rosuvastatin  (CRESTOR ) 10 MG tablet   Other Relevant Orders   Comprehensive metabolic panel with GFR   Sinus bradycardia   Secondary to timolol ophthalmic solution No acute ischemic changes on last EKG Normal cardiac CT scan done 04/09/2024      Relevant Medications   rosuvastatin  (CRESTOR ) 10 MG tablet     Other   Glaucoma suspect of  both eyes   Stable.  Sees ophthalmologist on a regular basis Has been on timolol eyedrops twice a day for about 10 years This is the cause of the sinus bradycardia      Dyslipidemia   Diet and nutrition discussed Cardiovascular risks associated with dyslipidemia discussed Not taking atorvastatin  due to lower abdominal itching Wants to start different statin Recommend Crestor  10 mg daily The 10-year ASCVD risk score (Arnett DK, et al., 2019) is: 8.8%   Values used to  calculate the score:     Age: 85 years     Clincally relevant sex: Male     Is Non-Hispanic African American: No     Diabetic: No     Tobacco smoker: No     Systolic Blood Pressure: 138 mmHg     Is BP treated: Yes     HDL Cholesterol: 39.9 mg/dL     Total Cholesterol: 166 mg/dL       Relevant Medications   rosuvastatin  (CRESTOR ) 10 MG tablet   Other Relevant Orders   Lipid panel   Lower urinary tract symptoms   Recently evaluated by urologist Office visit notes reviewed with patient Normal urinalysis and normal PSA Was started on tamsulosin  0.4 mg but has not noted any difference with his symptoms      Other Visit Diagnoses       Encounter for general adult medical examination with abnormal findings    -  Primary   Relevant Orders   Comprehensive metabolic panel with GFR   CBC with Differential/Platelet   Hemoglobin A1c   Lipid panel   PSA     Screening for deficiency anemia       Relevant Orders   CBC with Differential/Platelet     Screening for endocrine, metabolic and immunity disorder       Relevant Orders   Comprehensive metabolic panel with GFR   Hemoglobin A1c     Screening for prostate cancer       Relevant Orders   PSA      Modifiable risk factors discussed with patient. Anticipatory guidance according to age provided. The following topics were also discussed: Social Determinants of Health Smoking.  Non-smoker Diet and nutrition Benefits of exercise Cancer screening and review of  negative Cologuard test from 2023 Vaccinations review and recommendations Cardiovascular risk assessment The 10-year ASCVD risk score (Arnett DK, et al., 2019) is: 8.8%   Values used to calculate the score:     Age: 18 years     Clincally relevant sex: Male     Is Non-Hispanic African American: No     Diabetic: No     Tobacco smoker: No     Systolic Blood Pressure: 138 mmHg     Is BP treated: Yes     HDL Cholesterol: 39.9 mg/dL     Total Cholesterol: 166 mg/dL Review of chronic medical conditions and their management Review of most recent urologist office visit notes Reviewed most recent cardiac CT from 04/09/2024 Review of all medications Mental health including depression and anxiety Fall and accident prevention  Patient Instructions  Mantenimiento de Radiographer, therapeutic en los hombres Health Maintenance, Male Adoptar un estilo de vida saludable y recibir atencin preventiva son importantes para promover la salud y Counsellor. Consulte al mdico sobre: El esquema adecuado para hacerse pruebas y exmenes peridicos. Cosas que puede hacer por su cuenta para prevenir enfermedades y [place] sano. Qu debo saber sobre la dieta, el peso y el ejercicio? Consuma una dieta saludable  Consuma una dieta que incluya muchas verduras, frutas, productos lcteos con bajo contenido de grasa y protenas magras. No consuma muchos alimentos ricos en grasas slidas, azcares agregados o sodio. Mantenga un peso saludable El ndice de masa muscular [facility]) es una medida que puede utilizarse para identificar posibles problemas de [place]. Proporciona una estimacin de la grasa corporal basndose en el peso y la altura. Su mdico puede ayudarle a determinar su IMC y a Personnel officer o Pharmacologist un peso saludable. Haga  ejercicio con regularidad Haga ejercicio con regularidad. Esta es una de las prcticas ms importantes que puede hacer por su salud. La [employer] de los adultos deben seguir estas pautas: Education officer, environmental, al menos,  150 minutos de actividad fsica por semana. El ejercicio debe aumentar la frecuencia cardaca y Media planner transpirar (ejercicio de intensidad moderada). Hacer ejercicios de fortalecimiento por lo [employer] por semana. Agregue esto a su plan de ejercicio de intensidad moderada. Pase menos tiempo sentado. Incluso la actividad fsica ligera puede ser beneficiosa. Controle sus niveles de colesterol y lpidos en la sangre Comience a realizarse anlisis de lpidos y Oncologist en la sangre a los 20 aos y luego reptalos cada 5 aos. Es posible que Insurance underwriter los niveles de colesterol con mayor frecuencia si: Sus niveles de lpidos y colesterol son altos. Es mayor de 40 aos. Presenta un alto riesgo de padecer enfermedades cardacas. Qu debo saber sobre las pruebas de deteccin del cncer? Muchos tipos de cncer pueden detectarse de manera temprana y, a menudo, pueden prevenirse. Segn su historia clnica y sus antecedentes familiares, es posible que deba realizarse pruebas de deteccin del cncer en diferentes edades. Esto puede incluir pruebas de deteccin de lo siguiente: Building services engineer. Cncer de prstata. Cncer de piel. Cncer de pulmn. Qu debo saber sobre la enfermedad cardaca, la diabetes y la hipertensin arterial? Presin arterial y enfermedad cardaca La hipertensin arterial causa enfermedades cardacas y lesotho el riesgo de accidente cerebrovascular. Es ms probable que esto se manifieste en las personas que tienen lecturas de presin arterial alta o tienen sobrepeso. Hable con el mdico sobre sus valores de presin arterial deseados. Hgase controlar la presin arterial: Cada 3 a 5 aos si tiene entre 18 y 47 aos. Todos los aos si es mayor de 40 aos. Si tiene entre 65 y 49 aos y es fumador o Insurance underwriter, pregntele al mdico si debe realizarse una prueba de deteccin de aneurisma artico abdominal (AAA) por nica vez. Diabetes Realcese exmenes de deteccin  de la diabetes con regularidad. Este anlisis revisa el nivel de azcar en la sangre en [place]. Hgase las pruebas de deteccin: Cada tres aos despus de los 45 aos de edad si tiene un peso normal y un bajo riesgo de padecer diabetes. Con ms frecuencia y a partir de [place] edad inferior si tiene sobrepeso o un alto riesgo de padecer diabetes. Qu debo saber sobre la prevencin de infecciones? Hepatitis B Si tiene un riesgo ms alto de contraer hepatitis B, debe someterse a un examen de deteccin de este virus. Hable con el mdico para averiguar si tiene riesgo de contraer la infeccin por hepatitis B. Hepatitis C Se recomienda un anlisis de [place] para: Todos los que nacieron entre 1945 y [phone]. Todas las personas que tengan un riesgo de haber contrado hepatitis C. Enfermedades de transmisin sexual (ETS) Debe realizarse pruebas de deteccin de ITS todos los aos, incluidas la gonorrea y la clamidia, si: Es sexualmente activo y es menor de [address]. Es mayor de [address], y Public affairs consultant informa que corre riesgo de tener este tipo de infecciones. La actividad sexual ha cambiado desde que le hicieron la ltima prueba de deteccin y tiene un riesgo mayor de tener clamidia o Copy. Pregntele al mdico si usted tiene riesgo. Pregntele al mdico si usted tiene un alto riesgo de Primary school teacher VIH. El mdico tambin puede recomendarle un medicamento recetado para ayudar a evitar la infeccin por el VIH. Si elige tomar medicamentos para prevenir el VIH,  primero debe [employer] de deteccin del VIH. Luego debe hacerse anlisis cada 3 meses mientras est tomando los medicamentos. Siga estas indicaciones en su casa: Consumo de alcohol No beba alcohol si el mdico se lo prohbe. Si bebe alcohol: Limite la cantidad que consume de 0 a 2 bebidas por da. Sepa cunta cantidad de alcohol hay en las bebidas que toma. En los [address], una medida equivale a una botella de cerveza de 12 oz (355 ml), un  vaso de vino de 5 oz (148 ml) o un vaso de una bebida alcohlica de alta graduacin de 1 oz (44 ml). Estilo de vida No consuma ningn producto que contenga nicotina o tabaco. Estos productos incluyen cigarrillos, tabaco para Theatre manager y aparatos de vapeo, como los cigarrillos electrnicos. Si necesita ayuda para dejar de consumir estos productos, consulte al mdico. No consuma drogas. No comparta agujas. Solicite ayuda a su mdico si necesita apoyo o informacin para abandonar las drogas. Indicaciones generales Realcese los estudios de rutina de [address], dentales y de Wellsite geologist. Mantngase al da con las vacunas. Infrmele a su mdico si: Se siente deprimido con frecuencia. Alguna vez ha sido vctima de mal[place] o no se siente seguro en su casa. Resumen Adoptar un estilo de vida saludable y recibir atencin preventiva son importantes para promover la salud y Counsellor. Siga las instrucciones del mdico acerca de una dieta saludable, el ejercicio y la realizacin de pruebas o exmenes para Hotel manager. Siga las instrucciones del mdico con respecto al control del colesterol y la presin arterial. Esta informacin no tiene Theme park manager el consejo del mdico. Asegrese de hacerle al mdico cualquier pregunta que tenga. Document Revised: 05/16/2021 Document Reviewed: 05/16/2021 Elsevier Patient Education  2024 Elsevier Inc.     Emil Schaumann, MD [facility]

## 2024-09-13 NOTE — Assessment & Plan Note (Signed)
 BP Readings from Last 3 Encounters:  09/13/24 (!) 138/90  04/09/24 126/88  03/29/24 (!) 140/90  Elevated blood pressure reading in the office today.  Off medication. EKG shows sinus bradycardia. No findings of hypertensive heart disease. Normal cardiac CT scan 04/09/2024 Normal blood pressure readings at home Blood pressure lowers during exercise

## 2024-09-13 NOTE — Assessment & Plan Note (Signed)
 Secondary to timolol ophthalmic solution No acute ischemic changes on last EKG Normal cardiac CT scan done 04/09/2024

## 2024-09-13 NOTE — Patient Instructions (Signed)
 Mantenimiento de Research officer, political party, Male Adoptar un estilo de vida saludable y recibir atencin preventiva son importantes para promover la salud y Counsellor. Consulte al mdico sobre: El esquema adecuado para hacerse pruebas y exmenes peridicos. Cosas que puede hacer por su cuenta para prevenir enfermedades y [place] sano. Qu debo saber sobre la dieta, el peso y el ejercicio? Consuma una dieta saludable  Consuma una dieta que incluya muchas verduras, frutas, productos lcteos con bajo contenido de [place] y Associate Professor. No consuma muchos alimentos ricos en grasas slidas, azcares agregados o sodio. Mantenga un peso saludable El ndice de masa muscular [facility]) es una medida que puede utilizarse para identificar posibles problemas de [place]. Proporciona una estimacin de la grasa corporal basndose en el peso y la altura. Su mdico puede ayudarle a Engineer, site IMC y a Personnel officer o Pharmacologist un peso saludable. Haga ejercicio con regularidad Haga ejercicio con regularidad. Esta es una de las prcticas ms importantes que puede hacer por su salud. La [employer] de los adultos deben seguir estas pautas: Education officer, environmental, al menos, 150 minutos de actividad fsica por semana. El ejercicio debe aumentar la frecuencia cardaca y Media planner transpirar (ejercicio de intensidad moderada). Hacer ejercicios de fortalecimiento por lo [facility] por semana. Agregue esto a su plan de ejercicio de intensidad moderada. Pase menos tiempo sentado. Incluso la actividad fsica ligera puede ser beneficiosa. Controle sus niveles de colesterol y lpidos en la sangre Comience a realizarse anlisis de lpidos y Oncologist en la sangre a los 20 aos y luego reptalos cada 5 aos. Es posible que Insurance underwriter los niveles de colesterol con mayor frecuencia si: Sus niveles de lpidos y colesterol son altos. Es mayor de 40 aos. Presenta un alto riesgo de padecer enfermedades cardacas. Qu debo  saber sobre las pruebas de deteccin del cncer? Muchos tipos de cncer pueden detectarse de manera temprana y, a menudo, pueden prevenirse. Segn su historia clnica y sus antecedentes familiares, es posible que deba realizarse pruebas de deteccin del cncer en diferentes edades. Esto puede incluir pruebas de deteccin de lo siguiente: Building services engineer. Cncer de prstata. Cncer de piel. Cncer de pulmn. Qu debo saber sobre la enfermedad cardaca, la diabetes y la hipertensin arterial? Presin arterial y enfermedad cardaca La hipertensin arterial causa enfermedades cardacas y [place] el riesgo de accidente cerebrovascular. Es ms probable que esto se manifieste en las personas que tienen lecturas de presin arterial alta o tienen sobrepeso. Hable con el mdico sobre sus valores de presin arterial deseados. Hgase controlar la presin arterial: Cada 3 a 5 aos si tiene entre 18 y 71 aos. Todos los aos si es mayor de 40 aos. Si tiene entre 65 y 26 aos y es fumador o Insurance underwriter, pregntele al mdico si debe realizarse una prueba de deteccin de aneurisma artico abdominal (AAA) por nica vez. Diabetes Realcese exmenes de deteccin de la diabetes con regularidad. Este anlisis revisa el nivel de azcar en la sangre en [place]. Hgase las pruebas de deteccin: Cada tres aos despus de los 45 aos de edad si tiene un peso normal y un bajo riesgo de padecer diabetes. Con ms frecuencia y a partir de [place] edad inferior si tiene sobrepeso o un alto riesgo de padecer diabetes. Qu debo saber sobre la prevencin de infecciones? Hepatitis B Si tiene un riesgo ms alto de contraer hepatitis B, debe someterse a un examen de deteccin de este virus. Hable con el mdico para averiguar si tiene riesgo de  contraer la infeccin por hepatitis B. Hepatitis C Se recomienda un anlisis de [place] para: Todos los que nacieron entre 1945 y [phone]. Todas las personas que tengan un riesgo de haber  contrado hepatitis C. Enfermedades de transmisin sexual (ETS) Debe realizarse pruebas de deteccin de ITS todos los aos, incluidas la gonorrea y la clamidia, si: Es sexualmente activo y es menor de [address]. Es mayor de [address], y Public affairs consultant informa que corre riesgo de tener este tipo de infecciones. La actividad sexual ha cambiado desde que le hicieron la ltima prueba de deteccin y tiene un riesgo mayor de Warehouse manager clamidia o Copy. Pregntele al mdico si usted tiene riesgo. Pregntele al mdico si usted tiene un alto riesgo de Primary school teacher VIH. El mdico tambin puede recomendarle un medicamento recetado para ayudar a evitar la infeccin por el VIH. Si elige tomar medicamentos para prevenir el VIH, primero debe [employer] de deteccin del VIH. Luego debe hacerse anlisis cada 3 meses mientras est tomando los medicamentos. Siga estas indicaciones en su casa: Consumo de alcohol No beba alcohol si el mdico se lo prohbe. Si bebe alcohol: Limite la cantidad que consume de 0 a 2 bebidas por da. Sepa cunta cantidad de alcohol hay en las bebidas que toma. En los [address], una medida equivale a una botella de cerveza de 12 oz (355 ml), un vaso de vino de 5 oz (148 ml) o un vaso de una bebida alcohlica de alta graduacin de 1 oz (44 ml). Estilo de vida No consuma ningn producto que contenga nicotina o tabaco. Estos productos incluyen cigarrillos, tabaco para Theatre manager y aparatos de vapeo, como los Administrator, Civil Service. Si necesita ayuda para dejar de consumir estos productos, consulte al mdico. No consuma drogas. No comparta agujas. Solicite ayuda a su mdico si necesita apoyo o informacin para abandonar las drogas. Indicaciones generales Realcese los estudios de rutina de [address], dentales y de Wellsite geologist. Mantngase al da con las vacunas. Infrmele a su mdico si: Se siente deprimido con frecuencia. Alguna vez ha sido vctima de [place] o no se siente seguro en su  casa. Resumen Adoptar un estilo de vida saludable y recibir atencin preventiva son importantes para promover la salud y Counsellor. Siga las instrucciones del mdico acerca de una dieta saludable, el ejercicio y la realizacin de pruebas o exmenes para Hotel manager. Siga las instrucciones del mdico con respecto al control del colesterol y la presin arterial. Esta informacin no tiene Theme park manager el consejo del mdico. Asegrese de hacerle al mdico cualquier pregunta que tenga. Document Revised: 05/16/2021 Document Reviewed: 05/16/2021 Elsevier Patient Education  2024 [employer].

## 2024-09-13 NOTE — Assessment & Plan Note (Signed)
 Recently evaluated by urologist Office visit notes reviewed with patient Normal urinalysis and normal PSA Was started on tamsulosin  0.4 mg but has not noted any difference with his symptoms

## 2024-09-14 ENCOUNTER — Other Ambulatory Visit: Payer: Self-pay | Admitting: Emergency Medicine

## 2024-09-14 NOTE — Telephone Encounter (Signed)
 Rosuvastatin  prescription was sent yesterday to pharmacy of record.  Please look into this and make sure it went through.

## 2024-09-15 ENCOUNTER — Other Ambulatory Visit: Payer: Self-pay | Admitting: Radiology

## 2024-09-15 DIAGNOSIS — E785 Hyperlipidemia, unspecified: Secondary | ICD-10-CM

## 2024-09-15 MED ORDER — ROSUVASTATIN CALCIUM 10 MG PO TABS: 10.0000 mg | ORAL_TABLET | Freq: Every day | ORAL | 3 refills | Status: AC
